# Patient Record
Sex: Male | Born: 1955 | Race: Asian | Hispanic: No | Marital: Married | Smoking: Former smoker
Health system: Southern US, Community
[De-identification: ages and names within clinical notes are randomized; demographics above are authoritative.]

## PROBLEM LIST (undated history)

## (undated) DIAGNOSIS — IMO0002 Reserved for concepts with insufficient information to code with codable children: Secondary | ICD-10-CM

## (undated) DIAGNOSIS — I10 Essential (primary) hypertension: Secondary | ICD-10-CM

## (undated) DIAGNOSIS — K635 Polyp of colon: Secondary | ICD-10-CM

## (undated) DIAGNOSIS — M1990 Unspecified osteoarthritis, unspecified site: Secondary | ICD-10-CM

## (undated) DIAGNOSIS — E785 Hyperlipidemia, unspecified: Secondary | ICD-10-CM

## (undated) DIAGNOSIS — E119 Type 2 diabetes mellitus without complications: Secondary | ICD-10-CM

## (undated) HISTORY — PX: COLONOSCOPY: SHX174

## (undated) HISTORY — DX: Hyperlipidemia, unspecified: E78.5

## (undated) HISTORY — PX: COLON SURGERY: SHX602

## (undated) HISTORY — DX: Reserved for concepts with insufficient information to code with codable children: IMO0002

## (undated) HISTORY — DX: Unspecified osteoarthritis, unspecified site: M19.90

## (undated) HISTORY — DX: Essential (primary) hypertension: I10

## (undated) HISTORY — DX: Type 2 diabetes mellitus without complications: E11.9

---

## 2014-08-11 ENCOUNTER — Ambulatory Visit (INDEPENDENT_AMBULATORY_CARE_PROVIDER_SITE_OTHER): Payer: 59

## 2014-08-11 ENCOUNTER — Ambulatory Visit (INDEPENDENT_AMBULATORY_CARE_PROVIDER_SITE_OTHER): Payer: 59 | Admitting: Family Medicine

## 2014-08-11 VITALS — BP 110/70 | HR 88 | Temp 98.1°F | Resp 18 | Ht 62.5 in | Wt 156.6 lb

## 2014-08-11 DIAGNOSIS — R202 Paresthesia of skin: Secondary | ICD-10-CM | POA: Diagnosis not present

## 2014-08-11 DIAGNOSIS — Z8679 Personal history of other diseases of the circulatory system: Secondary | ICD-10-CM | POA: Diagnosis not present

## 2014-08-11 DIAGNOSIS — M6530 Trigger finger, unspecified finger: Secondary | ICD-10-CM | POA: Diagnosis not present

## 2014-08-11 DIAGNOSIS — M79645 Pain in left finger(s): Secondary | ICD-10-CM

## 2014-08-11 DIAGNOSIS — G5602 Carpal tunnel syndrome, left upper limb: Secondary | ICD-10-CM

## 2014-08-11 DIAGNOSIS — G5601 Carpal tunnel syndrome, right upper limb: Secondary | ICD-10-CM

## 2014-08-11 DIAGNOSIS — G5603 Carpal tunnel syndrome, bilateral upper limbs: Secondary | ICD-10-CM

## 2014-08-11 LAB — GLUCOSE, POCT (MANUAL RESULT ENTRY): POC GLUCOSE: 84 mg/dL (ref 70–99)

## 2014-08-11 LAB — POCT GLYCOSYLATED HEMOGLOBIN (HGB A1C): Hemoglobin A1C: 5.7

## 2014-08-11 MED ORDER — PREDNISONE 20 MG PO TABS: ORAL_TABLET | ORAL | Status: DC

## 2014-08-11 NOTE — Patient Instructions (Addendum)
Trigger Finger Trigger finger (digital tendinitis and stenosing tenosynovitis) is a common disorder that causes an often painful catching of the fingers or thumb. It occurs as a clicking, snapping, or locking of a finger in the palm of the hand. This is caused by a problem with the tendons that flex or bend the fingers sliding smoothly through their sheaths. The condition may occur in any finger or a couple fingers at the same time.  The finger may lock with the finger curled or suddenly straighten out with a snap. This is more common in patients with rheumatoid arthritis and diabetes. Left untreated, the condition may get worse to the point where the finger becomes locked in flexion, like making a fist, or less commonly locked with the finger straightened out. CAUSES   Inflammation and scarring that lead to swelling around the tendon sheath.  Repeated or forceful movements.  Rheumatoid arthritis, an autoimmune disease that affects joints.  Gout.  Diabetes mellitus. SIGNS AND SYMPTOMS  Soreness and swelling of your finger.  A painful clicking or snapping as you bend and straighten your finger. DIAGNOSIS  Your health care provider will do a physical exam of your finger to diagnose trigger finger. TREATMENT   Splinting for 6-8 weeks may be helpful.  Nonsteroidal anti-inflammatory medicines (NSAIDs) can help to relieve the pain and inflammation.  Cortisone injections, along with splinting, may speed up recovery. Several injections may be required. Cortisone may give relief after one injection.  Surgery is another treatment that may be used if conservative treatments do not work. Surgery can be minor, without incisions (a cut does not have to be made), and can be done with a needle through the skin.  Other surgical choices involve an open procedure in which the surgeon opens the hand through a small incision and cuts the pulley so the tendon can again slide smoothly. Your hand will still  work fine. HOME CARE INSTRUCTIONS  Apply ice to the injured area, twice per day:  Put ice in a plastic bag.  Place a towel between your skin and the bag.  Leave the ice on for 20 minutes, 3-4 times a day.  Rest your hand often. MAKE SURE YOU:   Understand these instructions.  Will watch your condition.  Will get help right away if you are not doing well or get worse. Document Released: 01/01/2004 Document Revised: 11/13/2012 Document Reviewed: 08/13/2012 [facility] Patient Information 2015 [place], [place]. This information is not intended to replace advice given to you by your health care provider. Make sure you discuss any questions you have with your health care provider.    Trigger Point Injection Trigger points are areas where you have muscle pain. A trigger point injection is a shot given in the trigger point to relieve that pain. A trigger point might feel like a knot in your muscle. It hurts to press on a trigger point. Sometimes the pain spreads out (radiates) to other parts of the body. For example, pressing on a trigger point in your shoulder might cause pain in your arm or neck. You might have one trigger point. Or, you might have more than one. People often have trigger points in their upper back and lower back. They also occur often in the neck and shoulders. Pain from a trigger point lasts for a long time. It can make it hard to keep moving. You might not be able to do the exercise or physical therapy that could help you deal with the pain. A  trigger point injection may help. It does not work for everyone. But, it may relieve your pain for a few days or a few months. A trigger point injection does not cure long-lasting (chronic) pain. LET YOUR CAREGIVER KNOW ABOUT:  Any allergies (especially to latex, lidocaine, or steroids).  Blood-thinning medicines that you take. These drugs can lead to bleeding or bruising after an injection. They  include:  Aspirin.  Ibuprofen.  Clopidogrel.  Warfarin.  Other medicines you take. This includes all vitamins, herbs, eyedrops, over-the-counter medicines, and creams.  Use of steroids.  Recent infections.  Past problems with numbing medicines.  Bleeding problems.  Surgeries you have had.  Other health problems. RISKS AND COMPLICATIONS A trigger point injection is a safe treatment. However, problems may develop, such as:  Minor side effects usually go away in 1 to 2 days. These may include:  Soreness.  Bruising.  Stiffness.  More serious problems are rare. But, they may include:  Bleeding under the skin (hematoma).  Skin infection.  Breaking off of the needle under your skin.  Lung puncture.  The trigger point injection may not work for you. BEFORE THE PROCEDURE You may need to stop taking any medicine that thins your blood. This is to prevent bleeding and bruising. Usually these medicines are stopped several days before the injection. No other preparation is needed. PROCEDURE  A trigger point injection can be given in your caregiver's office or in a clinic. Each injection takes 2 minutes or less.  Your caregiver will feel for trigger points. The caregiver may use a marker to circle the area for the injection.  The skin over the trigger point will be washed with a germ-killing (antiseptic) solution.  The caregiver pinches the spot for the injection.  Then, a very thin needle is used for the shot. You may feel pain or a twitching feeling when the needle enters the trigger point.  A numbing solution may be injected into the trigger point. Sometimes a drug to keep down swelling, redness, and warmth (inflammation) is also injected.  Your caregiver moves the needle around the trigger zone until the tightness and twitching goes away.  After the injection, your caregiver may put gentle pressure over the injection site.  Then it is covered with a  bandage. AFTER THE PROCEDURE  You can go right home after the injection.  The bandage can be taken off after a few hours.  You may feel sore and stiff for 1 to 2 days.  Go back to your regular activities slowly. Your caregiver may ask you to stretch your muscles. Do not do anything that takes extra energy for a few days.  Follow your caregiver's instructions to manage and treat other pain. Document Released: 03/02/2011 Document Revised: 07/08/2012 Document Reviewed: 03/02/2011 [facility] Patient Information 2015 [place], [place]. This information is not intended to replace advice given to you by your health care provider. Make sure you discuss any questions you have with your health care provider.   Carpal Tunnel Syndrome The carpal tunnel is a narrow area located on the palm side of your wrist. The tunnel is formed by the wrist bones and ligaments. Nerves, blood vessels, and tendons pass through the carpal tunnel. Repeated wrist motion or certain diseases may cause swelling within the tunnel. This swelling pinches the main nerve in the wrist (median nerve) and causes the painful hand and arm condition called carpal tunnel syndrome. CAUSES   Repeated wrist motions.  Wrist injuries.  Certain diseases like  arthritis, diabetes, alcoholism, hyperthyroidism, and kidney failure.  Obesity.  Pregnancy. SYMPTOMS   A "pins and needles" feeling in your fingers or hand, especially in your thumb, index and middle fingers.  Tingling or numbness in your fingers or hand.  An aching feeling in your entire arm, especially when your wrist and elbow are bent for long periods of time.  Wrist pain that goes up your arm to your shoulder.  Pain that goes down into your palm or fingers.  A weak feeling in your hands. DIAGNOSIS  Your health care provider will take your history and perform a physical exam. An electromyography test may be needed. This test measures electrical signals sent out by your  nerves into the muscles. The electrical signals are usually slowed by carpal tunnel syndrome. You may also need X-rays. TREATMENT  Carpal tunnel syndrome may clear up by itself. Your health care provider may recommend a wrist splint or medicine such as a nonsteroidal anti-inflammatory medicine. Cortisone injections may help. Sometimes, surgery may be needed to free the pinched nerve.  HOME CARE INSTRUCTIONS   Take all medicine as directed by your health care provider. Only take over-the-counter or prescription medicines for pain, discomfort, or fever as directed by your health care provider.  If you were given a splint to keep your wrist from bending, wear it as directed. It is important to wear the splint at night. Wear the splint for as long as you have pain or numbness in your hand, arm, or wrist. This may take 1 to 2 months.  Rest your wrist from any activity that may be causing your pain. If your symptoms are work-related, you may need to talk to your employer about changing to a job that does not require using your wrist.  Put ice on your wrist after long periods of wrist activity.  Put ice in a plastic bag.  Place a towel between your skin and the bag.  Leave the ice on for 15-20 minutes, 03-04 times a day.  Keep all follow-up visits as directed by your health care provider. This includes any orthopedic referrals, physical therapy, and rehabilitation. Any delay in getting necessary care could result in a delay or failure of your condition to heal. SEEK IMMEDIATE MEDICAL CARE IF:   You have new, unexplained symptoms.  Your symptoms get worse and are not helped or controlled with medicines. MAKE SURE YOU:   Understand these instructions.  Will watch your condition.  Will get help right away if you are not doing well or get worse. Document Released: 03/10/2000 Document Revised: 07/28/2013 Document Reviewed: 01/27/2011 [facility] Patient Information 2015 [place], [place]. This  information is not intended to replace advice given to you by your health care provider. Make sure you discuss any questions you have with your health care provider.    H?i Ch?ng ?ng C? Tay (Carpal Tunnel Syndrome) ?ng c? tay l m?t vng h?p n?m ? m?t lng bn tay c?a c? tay. ?ng ???c hnh thnh b?i cc x??ng c? tay v dy ch?ng. Dy th?n kinh, m?ch mu v gn ?i qua ?ng c? tay. C? ??ng c? tay l?p ?i l?p l?i ho?c cc b?nh l nh?t ??nh c th? gy s?ng ? bn trong ?ng ny. Ch? s?ng ny b ch?t dy th?n kinh chnh ? c? tay (dy th?n kinh gi?a) v gy tnh tr?ng ?au ? bn tay v cnh tay ???c g?i l h?i ch?ng ?ng c? tay.  NGUYN NHN  C? ??ng c? tay l?p ?  i l?p l?i.  Ch?n th??ng c? tay.  M?t s? b?nh nh?t ??nh nh? vim kh?p, b?nh ti?u ???ng, nghi?n r??u, c??ng gip v suy th?n.  Bo ph.  Mang thai. TRI?U CH?NG  C?m gic "r?n r?n nh? ki?n b" ? cc ngn tay ho?c bn tay.  ?au bu?t ho?c t ? ngn tay ho?c bn tay.  C?m gic ?au nh?c ? ton b? cnh tay.  ?au c? tay lan ln cnh tay t?i vai.  ?au lan xu?ng lng bn tay ho?c ngn tay.  C?m gic y?u ? bn tay. CH?N ?ON Chuyn gia ch?m [place] s?c kh?e s? xem b?nh s? c?a b?n v khm th?c th?. C th? c?n ph?i ki?m tra ?i?n c? ??. Ki?m tra ny ?o tn hi?u ?i?n do cc c? g?i ?i. Cc tn hi?u ?i?n ny th??ng b? ch?m l?i b?i h?i ch?ng ?ng c? tay. B?n c?ng c th? c?n ph?i ch?p X-quang. ?I?U TR? H?i ch?ng ?ng c? tay c th? t? kh?i. Chuyn gia ch?m [place] s?c kh?e c?a b?n c th? khuy?n ngh? n?p c? tay ho?c s? d?ng thu?c, ch?ng h?n nh? thu?c khng vim khng c steroid. Tim cortison c th? h?u ch. ?i khi, c th? c?n ph?i ph?u thu?t ?? gi?i phng dy th?n kinh b? b ch?t. H??NG D?N CH?M [place] T?I NH  S? d?ng t?t c? thu?c theo ch? d?n c?a chuyn gia ch?m [place] s?c kh?e. Ch? s? d?ng thu?c khng c?n k toa ho?c thu?c c?n k toa ?? gi?m ?au, gi?m c?m gic kh ch?u ho?c h? s?t theo ch? d?n c?a chuyn gia ch?m [place] s?c kh?e c?a b?n.  N?u b?n ???c n?p ?? gi? cho  c? tay khng b? cong, hy s? d?ng n?p theo ch? d?n. ?i?u quan tr?ng l c?n mang n?p vo ban ?m. S? d?ng n?p cho ??n ch?ng no b?n cn b? ?au ho?c t ? bn tay, cnh tay ho?c c? tay. C th? m?t 1 ??n 2 thng.  Khng s? d?ng c? tay vo b?t k? ho?t ??ng no c th? gy ?au. N?u cc tri?u ch?ng c?a b?n c lin quan ??n cng vi?c, b?n c th? c?n ph?i ni chuy?n v?i ng??i s? d?ng lao ??ng c?a mnh v? vi?c ??i sang m?t cng vi?c khng c?n s? d?ng c? tay.  Ch??m ? l?nh ln c? tay sau th?i gian ho?t ??ng c? tay ko di.  Cho ? l?nh vo ti nh?a.  ??t kh?n t?m gi?a da v ti.  Ch??m ? l?nh trong 15 ??n 20 pht, 3 ??n 4 l?n m?i ngy.  Gi? m?i cu?c h?n khm l?i theo ch? d?n c?a chuyn gia ch?m [place] s?c kh?e. Cc cu?c h?n ny bao g?m b?t k? cu?c h?n chuy?n khm chuyn khoa ch?nh hnh, v?t l tr? li?u v ph?c h?i ch?c n?ng no. B?t k? s? ch?m tr? no trong vi?c nh?n ch?m [place] c?n thi?t c th? d?n ??n s? ch?m tr? ho?c tnh tr?ng b?nh c?a b?n khng kh?i. HY NGAY L?P T?C ?I KHM N?U:  B?n c nh?ng tri?u ch?ng m?i khng r nguyn nhn.  Cc tri?u ch?ng c?a b?n tr? nn t?i t? h?n v khng c?i thi?n ho?c ki?m sot ???c b?ng thu?c. ??M B?O B?N:  Hi?u cc h??ng d?n ny.  S? theo di tnh tr?ng c?a mnh.  S? yu c?u tr? gip ngay l?p t?c n?u b?n c?m th?y khng ?? ho?c tnh tr?ng tr?m tr?ng h?n. Document Released: 03/13/2005 Document Revised: 11/13/2012 [facility] Patient Information 2015 [employer]. This information is not intended to replace advice given to you by your health care provider. Make sure you discuss any questions you have with your health care provider.  

## 2014-08-11 NOTE — Progress Notes (Signed)
MRN: [id number] DOB: 1955-05-25  Subjective:   David Morales is a 59 y.o. male presenting for chief complaint of Other  Reports 1 month history of worsening hand numbness and tingling and left thumb pain. Pain is constant, achy, worst in the morning ~15 minutes, becomes sharp with flexion of his left thumb. Has not tried medications for relief. Denies trauma, numbness and tingling of his feet, polyuria, polydipsia, polyphagia. Denies decreased ROM or strength, erythema, edema, radiation of pain. Denies previous hand surgery or hand injury. Works in [employer], does food prep and washes dishes. Diet consists of rice, vegetables, drinks plenty of water, also drinks coffee. Smokes 1/3 ppd for the past 5 years, does not drink alcohol. Denies any other aggravating or relieving factors, no other questions or concerns.  [name] currently has no medications in their medication list. He has No Known Allergies.  [name]  has a past medical history of Hyperlipidemia and Hypertension. Also  has past surgical history that includes Colon surgery.  ROS As in subjective.  Objective:   Vitals: BP 110/70 mmHg  Pulse 88  Temp(Src) 98.1 F (36.7 C) (Oral)  Resp 18  Ht 5' 2.5" (1.588 m)  Wt 156 lb 9.6 oz (71.033 kg)  BMI 28.17 kg/m2  SpO2 97%  Physical Exam  Constitutional: He is oriented to person, place, and time. He appears well-developed and well-nourished.  Cardiovascular: Normal rate, regular rhythm and intact distal pulses.  Exam reveals no gallop and no friction rub.   No murmur heard. Pulmonary/Chest: No respiratory distress. He has no wheezes. He has no rales. He exhibits no tenderness.  Musculoskeletal:       Left hand: He exhibits tenderness (over depicted areas, worse with flexion of thumb). He exhibits normal range of motion, normal capillary refill, no deformity, no laceration and no swelling. Normal sensation noted. Normal strength noted.       Hands: Positive Phalens test, negative  Tinnel.  Neurological: He is alert and oriented to person, place, and time.  Skin: Skin is warm and dry. No rash noted. No erythema. No pallor.   [facility] reading (PRIMARY) by  Dr. Joseph Art and PA-Toyia Jelinek. Left thumb: Normal x-ray.  Results for orders placed or performed in visit on 08/11/14 (from the past 24 hour(s))  POCT glycosylated hemoglobin (Hb A1C)     Status: None   Collection Time: 08/11/14  5:55 PM  Result Value Ref Range   Hemoglobin A1C 5.7   POCT glucose (manual entry)     Status: None   Collection Time: 08/11/14  5:55 PM  Result Value Ref Range   POC Glucose 84 70 - 99 mg/dl   Assessment and Plan :   1. Trigger finger, acquired 2. Thumb pain, left - will start steroid course, advised modification of activities with hands at work, patient is to call back following steroid course to see if there was any improvement. Consider referral to ortho for further intervention.  3. Paresthesia of both hands 4. Bilateral carpal tunnel syndrome - steroid course as above may help patient, also advised to wear wrist splint at night. Modification of work with hands as above. Consider NSAID therapy or referral to ortho.  5. Patient has a history of hypertension, stopped taking medications ~2 years ago. Has not had follow up. States that his blood pressure has been normal since coming off medications. Advised that he have complete physical exam.  Jaynee Eagles, PA-C [facility] [phone] 08/11/2014  5:03 PM    History and physical exam reviewed with [name] at bedside. Agree with this plan.  Signed, Carola Frost.D.

## 2014-09-01 ENCOUNTER — Telehealth: Payer: Self-pay

## 2014-09-01 DIAGNOSIS — M6530 Trigger finger, unspecified finger: Secondary | ICD-10-CM

## 2014-09-01 NOTE — Telephone Encounter (Signed)
Pt daughter is calling about getting pt a referral about his trigger finger

## 2014-09-02 NOTE — Telephone Encounter (Signed)
Spoke with daughter, advised referral put in.

## 2014-11-04 ENCOUNTER — Ambulatory Visit (INDEPENDENT_AMBULATORY_CARE_PROVIDER_SITE_OTHER): Payer: 59 | Admitting: Family Medicine

## 2014-11-04 VITALS — BP 94/52 | HR 83 | Temp 98.3°F | Resp 14 | Ht 61.5 in | Wt 148.6 lb

## 2014-11-04 DIAGNOSIS — K5900 Constipation, unspecified: Secondary | ICD-10-CM | POA: Diagnosis not present

## 2014-11-04 DIAGNOSIS — Z860100 Personal history of colon polyps, unspecified: Secondary | ICD-10-CM

## 2014-11-04 DIAGNOSIS — K921 Melena: Secondary | ICD-10-CM | POA: Diagnosis not present

## 2014-11-04 LAB — POCT CBC
Granulocyte percent: 55.5 %G (ref 37–80)
HCT, POC: 56 % — AB (ref 43.5–53.7)
Hemoglobin: 15 g/dL (ref 14.1–18.1)
Lymph, poc: 3.1 (ref 0.6–3.4)
MCH, POC: 30 pg (ref 27–31.2)
MCHC: 32.6 g/dL (ref 31.8–35.4)
MCV: 92.1 fL (ref 80–97)
MID (cbc): 1 — AB (ref 0–0.9)
MPV: 7.6 fL (ref 0–99.8)
POC Granulocyte: 5.1 (ref 2–6.9)
POC LYMPH PERCENT: 33.6 %L (ref 10–50)
POC MID %: 10.9 %M (ref 0–12)
Platelet Count, POC: 262 10*3/uL (ref 142–424)
RBC: 4.99 M/uL (ref 4.69–6.13)
RDW, POC: 13 %
WBC: 9.1 10*3/uL (ref 4.6–10.2)

## 2014-11-04 NOTE — Patient Instructions (Signed)
To bn (Constipation) To bn l khi m?t ng??i ?i ??i ti?n t h?n ba l?n trong m?t [place]?n, ??i ti?n kh kh?n, ho?c ??i ti?n ra phn kh, c?ng, ho?c to h?n bnh th??ng. [place]hi [place]?i cng cao th cng d? b? to bn. N?u qu v? c? g?ng ch?a to bn b?ng cc lo?i thu?c gip qu v? ??i ti?n ???c (thu?c nhu?n trng), b?nh c th? n?ng thm. S? d?ng thu?c nhu?n trng trong th?i gian di c th? lm cho cc c? c?a ru?t gi y?u ?i. Ch? ?? ?n thi?u ch?t x?, khng u?ng ?? n??c, v dng m?t s? lo?i thu?c nh?t ??nh c th? lm to bo n?ng thm.  NGUYN NHN.   M?t s? lo?i thu?c nh?t ??nh nh? thu?c ch?ng tr?m c?m, thu?c gi?m ?au, thu?c c b? sung s?t, thu?c trung ha axit d?ch v?, v thu?c l?i ti?u.  M?t s? b?nh l nh?t ??nh nh? ti?u ???ng, h?i ch?ng ru?t kch thch (IBS), b?nh c?a tuy?n gip, ho?c tr?m c?m.  Khng u?ng ?? n??c.  Khng ?n ?? th?c ?n giu ch?t x?.  C?ng th?ng ho?c do ?i l?i.  t ho?t ??ng thn th? ho?c th? d?c.  Nh?n ?i ??i ti?n.  S? d?ng qu nhi?u thu?c nhu?n trng. D?U HI?U V TRI?U CH?NG   ?i ??i ti?n t h?n ba l?n m?i [place]?n.  Ph?i r?n m?nh ?? ??i ti?n.  ??i ti?n ra phn c?ng, kh, ho?c to h?n bnh th??ng.  C?m th?y ??y b?ng ho?c ch??ng b?ng.  ?au ? vng b?ng d??i.  Khng c?m th?y tho?i mi sau khi ??i ti?n. CH?N ?ON  Chuyn gia ch?m [place] s?c kh?e c?a qu v? s? h?i v? b?nh s? v khm th?c th? cho qu v?. C th? c?n ki?m tra thm trong tr??ng h?p to bn n?ng. M?t s? ki?m tra c th? bao g?m:  Ch?p X quang c ch?t c?n quang ?? ki?m tra tr?c trng, ??i trng, v ?i khi c? ru?t non c?a qu v?.  N?i soi tr?c trng sigma ?? ki?m tra ph?n pha d??i c?a ??i trng.  Th? thu?t soi ??i trng ?? khm ton b? ??i trng. ?I?U TR?  ?i?u tr? ty thu?c vo m?c ?? tr?m tr?ng c?a ch?ng to bn v nguyn nhn gy to bn. ?i?u tr? thng qua ch? ?? ?n u?ng bao g?m u?ng nhi?u n??c h?n v ?n th?c ?n c nhi?u ch?t x? h?n. ?i?u tr? thng qua l?i s?ng c th? bao g?m vi?c t?p th? d?c th??ng xuyn. N?u  nh?ng khuy?n ngh? v? ch? ?? ?n v l?i s?ng khng c tc d?ng, chuyn gia ch?m [place] s?c kh?e c th? khuy?n ngh? qu v? dng cc lo?i thu?c nhu?n trng khng c?n k ??n ?? gip qu v? ??i ti?n. C th? ph?i k ??n thu?c c?n k ??n n?u thu?c khng c?n k ??n khng c tc d?ng.  H??NG D?N CH?M [place] T?I NH   ?n th?c ?n c nhi?u ch?t x? nh? tri cy, rau, ng? c?c nguyn h?t, v cc lo?i ??u.  H?n ch? th?c ?n c nhi?u ch?t bo v ???ng ch? bi?n s?n, ch?ng h?n khoai ty chin, bnh hamburger, bnh quy, k?o, v soda.  C th? dng th?c ph?m ch?c n?ng c b? sung ch?t x? vo kh?u ph?n ?n c?a qu v? n?u qu v? khng th? dng ?? ch?t x? t? th?c ?n.  U?ng ?? n??c ?? gi? cho n??c ti?u trong ho?c vng nh?t.  T?p th? d?c th??ng xuyn ho?c  theo ch? d?n c?a chuyn gia ch?m [place] s?c kh?e.  Vo nh v? sinh ngay khi qu v? c nhu c?u. Khng nh?n ?i ??i ti?n.  Ch? s? d?ng thu?c khng c?n k ??n ho?c thu?c c?n k ??n theo ch? d?n c?a chuyn gia ch?m [place] s?c kh?e.Khng dng cc lo?i thu?c ch?ng to bn khc m khng bn b?c tr??c v?i chuyn gia ch?m [place] s?c kh?e. NGAY L?P T?C ?I KHM N?U:   ?i ??i ti?n ra mu ?? t??i.  Ch?ng to bn ko di h?n 4 ngy v tr?m tr?ng h?n.  Qu v? b? ?au b?ng ho?c ?au tr?c trng.  Phn c?a qu v? m?ng, trng nh? bt ch.  Qu v? b? s?t cn khng r nguyn nhn. ??M B?O QU V?:   Hi?u r cc h??ng d?n ny.  S? theo di tnh tr?ng c?a mnh.  S? yu c?u tr? gip ngay l?p t?c n?u qu v? c?m th?y khng kh?e ho?c th?y tr?m tr?ng h?n. Document Released: 06/28/2010 Document Revised: 03/18/2013 [facility] Patient Information 2015 [employer]. This information is not intended to replace advice given to you by your health care provider. Make sure you discuss any questions you have with your health care provider.

## 2014-11-04 NOTE — Progress Notes (Signed)
Chief Complaint:  Chief Complaint  Patient presents with  . Blood In Stools    x 1 month  . Constipation    HPI: David Morales is a 59 y.o. male who reports to [facility] today complaining of needing a referral to GI doctor.  History of colon polyps in [place], was told by GI doctor there thathe needed a colonoscopy every 6 months to 1 year. He had one about 1 year ago and had polyps removed.  He was more sedentary in [place] but now he is more active so has lost weight and no longer needs to be on meds.  He was on HTN and hyperlipidemia meds. His diet is a balanced diet with fiber, occasional constipation and has to strain, No recent melena or hematochezia but willhave occ bloody streaks on tissue paper after he strains.  He has no family hx of colon cancer  Emiri [phone] ( step daughter speaks [place])    Past Medical History  Diagnosis Date  . Hyperlipidemia   . Hypertension    Past Surgical History  Procedure Laterality Date  . Colon surgery     Social History   Social History  . Marital Status: Married    Spouse Name: N/A  . Number of Children: N/A  . Years of Education: N/A   Social History Main Topics  . Smoking status: Current Every Day Smoker -- 5 years  . Smokeless tobacco: None  . Alcohol Use: 0.0 oz/week    0 Standard drinks or equivalent per week  . Drug Use: No  . Sexual Activity: Not Asked   Other Topics Concern  . None   Social History Narrative   History reviewed. No pertinent family history. No Known Allergies Prior to Admission medications   Not on File     ROS: The patient denies fevers, chills, night sweats, unintentional weight loss, chest pain, palpitations, wheezing, dyspnea on exertion, nausea, vomiting, abdominal pain, dysuria, hematuria, melena, numbness, weakness, or tingling.   All other systems have been reviewed and were otherwise negative with the exception of those mentioned in the HPI and as above.    PHYSICAL  EXAM: Filed Vitals:   11/04/14 1903  BP: 94/52  Pulse: 83  Temp: 98.3 F (36.8 C)  Resp: 14   Body mass index is 27.63 kg/(m^2).   General: Alert, no acute distress HEENT:  Normocephalic, atraumatic, oropharynx patent. EOMI, PERRLA Cardiovascular:  Regular rate and rhythm, no rubs murmurs or gallops.  No Carotid bruits, radial pulse intact. No pedal edema.  Respiratory: Clear to auscultation bilaterally.  No wheezes, rales, or rhonchi.  No cyanosis, no use of accessory musculature Abdominal: No organomegaly, abdomen is soft and non-tender, positive bowel sounds. No masses. Skin: No rashes. Neurologic: Facial musculature symmetric. Psychiatric: Patient acts appropriately throughout our interaction. Lymphatic: No cervical or submandibular lymphadenopathy Musculoskeletal: Gait intact. No edema, tenderness No appreciable fissures or hemorrhoids on rectal exam  LABS: Results for orders placed or performed in visit on 11/04/14  POCT CBC  Result Value Ref Range   WBC 9.1 4.6 - 10.2 K/uL   Lymph, poc 3.1 0.6 - 3.4   POC LYMPH PERCENT 33.6 10 - 50 %L   MID (cbc) 1.0 (A) 0 - 0.9   POC MID % 10.9 0 - 12 %M   POC Granulocyte 5.1 2 - 6.9   Granulocyte percent 55.5 37 - 80 %G   RBC 4.99 4.69 - 6.13 M/uL   Hemoglobin 15.0 14.1 -  18.1 g/dL   HCT, POC 56.0 (A) 43.5 - 53.7 %   MCV 92.1 80 - 97 fL   MCH, POC 30.0 27 - 31.2 pg   MCHC 32.6 31.8 - 35.4 g/dL   RDW, POC 13.0 %   Platelet Count, POC 262 142 - 424 K/uL   MPV 7.6 0 - 99.8 fL     EKG/XRAY:   Primary read interpreted by Dr. Marin Comment at [facility].   ASSESSMENT/PLAN: Encounter Diagnoses  Name Primary?  . History of colonic polyps Yes  . Constipation, unspecified constipation type   . Bloody stool    Otc miralax, increase fiber and water intake prn  Refer to GI [facility] in several months for cholesterol check  Gross sideeffects, risk and benefits, and alternatives of medications d/w patient. Patient is aware that all medications have  potential sideeffects and we are unable to predict every sideeffect or drug-drug interaction that may occur.  Farah Benish DO  11/04/2014 8:10 PM

## 2014-11-05 LAB — COMPLETE METABOLIC PANEL WITHOUT GFR
AST: 29 U/L (ref 10–35)
Albumin: 4.3 g/dL (ref 3.6–5.1)
Alkaline Phosphatase: 69 U/L (ref 40–115)
GFR, Est African American: >89 mL/min (ref >=60)
Glucose, Bld: 133 mg/dL — ABNORMAL HIGH (ref 65–99)
Potassium: 3.9 mmol/L (ref 3.5–5.3)
Sodium: 138 mmol/L (ref 135–146)

## 2014-11-05 LAB — COMPLETE METABOLIC PANEL WITH GFR
ALT: 38 U/L (ref 9–46)
BUN: 22 mg/dL (ref 7–25)
CO2: 30 mmol/L (ref 20–31)
Calcium: 9.4 mg/dL (ref 8.6–10.3)
Chloride: 103 mmol/L (ref 98–110)
Creat: 1.01 mg/dL (ref 0.70–1.33)
GFR, Est Non African American: 81 mL/min (ref >=60)
Total Bilirubin: 0.3 mg/dL (ref 0.2–1.2)
Total Protein: 7.2 g/dL (ref 6.1–8.1)

## 2014-11-27 ENCOUNTER — Encounter: Payer: Self-pay | Admitting: Family Medicine

## 2015-05-13 ENCOUNTER — Encounter (HOSPITAL_BASED_OUTPATIENT_CLINIC_OR_DEPARTMENT_OTHER): Payer: Self-pay

## 2015-05-13 ENCOUNTER — Ambulatory Visit (HOSPITAL_BASED_OUTPATIENT_CLINIC_OR_DEPARTMENT_OTHER)
Admission: RE | Admit: 2015-05-13 | Discharge: 2015-05-13 | Disposition: A | Discharge status: Home / Self Care | Payer: BLUE CROSS/BLUE SHIELD | Source: Ambulatory Visit | Attending: Physician Assistant | Admitting: Physician Assistant

## 2015-05-13 ENCOUNTER — Ambulatory Visit (INDEPENDENT_AMBULATORY_CARE_PROVIDER_SITE_OTHER): Payer: BLUE CROSS/BLUE SHIELD | Admitting: Physician Assistant

## 2015-05-13 VITALS — BP 110/70 | HR 72 | Temp 97.9°F | Resp 18 | Ht 62.0 in | Wt 153.8 lb

## 2015-05-13 DIAGNOSIS — Z87891 Personal history of nicotine dependence: Secondary | ICD-10-CM | POA: Diagnosis not present

## 2015-05-13 DIAGNOSIS — C189 Malignant neoplasm of colon, unspecified: Secondary | ICD-10-CM | POA: Diagnosis present

## 2015-05-13 DIAGNOSIS — I10 Essential (primary) hypertension: Secondary | ICD-10-CM | POA: Diagnosis not present

## 2015-05-13 DIAGNOSIS — E785 Hyperlipidemia, unspecified: Secondary | ICD-10-CM | POA: Diagnosis not present

## 2015-05-13 DIAGNOSIS — K6389 Other specified diseases of intestine: Secondary | ICD-10-CM | POA: Diagnosis not present

## 2015-05-13 LAB — POCT CBC
GRANULOCYTE PERCENT: 55.9 % (ref 37–80)
HCT, POC: 44.3 % (ref 43.5–53.7)
HEMOGLOBIN: 15.6 g/dL (ref 14.1–18.1)
Lymph, poc: 2.9 (ref 0.6–3.4)
MCH: 32 pg — AB (ref 27–31.2)
MCHC: 35.3 g/dL (ref 31.8–35.4)
MCV: 90.8 fL (ref 80–97)
MID (CBC): 0.7 (ref 0–0.9)
MPV: 7.7 fL (ref 0–99.8)
POC Granulocyte: 4.5 (ref 2–6.9)
POC LYMPH PERCENT: 35.2 %L (ref 10–50)
POC MID %: 8.9 % (ref 0–12)
Platelet Count, POC: 263 10*3/uL (ref 142–424)
RBC: 4.88 M/uL (ref 4.69–6.13)
RDW, POC: 12.6 %
WBC: 8.1 10*3/uL (ref 4.6–10.2)

## 2015-05-13 MED ORDER — IOHEXOL 300 MG/ML  SOLN
100.0000 mL | Freq: Once | INTRAMUSCULAR | Status: AC | PRN
  Administered 2015-05-13: 100 mL via INTRAVENOUS

## 2015-05-13 NOTE — Patient Instructions (Signed)
Go to [place] register 1st floor radiology for outpatient CT  [facility]  [place]

## 2015-05-13 NOTE — Progress Notes (Signed)
05/13/2015 5:52 PM   DOB: Aug 02, 1955 / MRN: [id number]  SUBJECTIVE:  David Morales is a 60 y.o. male presenting for a recent colonoscopy that was positive for adenocarcinoma ten days ago. Per the surgeon's report, which is in [place], the lesion needs to be cut out immediately.  He is here today for a referral to a Psychologist, sport and exercise.      He has No Known Allergies.   He  has a past medical history of Hyperlipidemia and Hypertension.    He  reports that he has quit smoking. He does not have any smokeless tobacco history on file. He reports that he drinks alcohol. He reports that he does not use illicit drugs. He  has no sexual activity history on file. The patient  has past surgical history that includes Colon surgery and Colonoscopy.  His family history is not on file.  Review of Systems  Constitutional: Negative for fever and chills.  Respiratory: Negative for cough.   Cardiovascular: Negative for chest pain.  Gastrointestinal: Negative for nausea.  Genitourinary: Negative for dysuria.  Musculoskeletal: Negative for myalgias.  Skin: Negative for rash.  Neurological: Negative for dizziness and headaches.  Psychiatric/Behavioral: Negative for depression.    Problem list and medications reviewed and updated by myself where necessary, and exist elsewhere in the encounter.   OBJECTIVE:  BP 110/70 mmHg  Pulse 72  Temp(Src) 97.9 F (36.6 C) (Oral)  Resp 18  Ht 5\' 2"  (1.575 m)  Wt 153 lb 12.8 oz (69.763 kg)  BMI 28.12 kg/m2  SpO2 98%  Physical Exam  Constitutional: He is oriented to person, place, and time. He appears well-developed. He does not appear ill.  Eyes: Conjunctivae and EOM are normal. Pupils are equal, round, and reactive to light.  Cardiovascular: Normal rate.   Pulmonary/Chest: Effort normal.  Abdominal: Soft. Bowel sounds are normal. He exhibits no mass. There is no tenderness. There is no rebound and no guarding.  Musculoskeletal: Normal range of motion.  Neurological: He  is alert and oriented to person, place, and time. No cranial nerve deficit. Coordination normal.  Skin: Skin is warm and dry. He is not diaphoretic.  Psychiatric: He has a normal mood and affect.  Nursing note and vitals reviewed.   Results for orders placed or performed in visit on 05/13/15 (from the past 72 hour(s))  POCT CBC     Status: Abnormal   Collection Time: 05/13/15 12:31 PM  Result Value Ref Range   WBC 8.1 4.6 - 10.2 K/uL   Lymph, poc 2.9 0.6 - 3.4   POC LYMPH PERCENT 35.2 10 - 50 %L   MID (cbc) 0.7 0 - 0.9   POC MID % 8.9 0 - 12 %M   POC Granulocyte 4.5 2 - 6.9   Granulocyte percent 55.9 37 - 80 %G   RBC 4.88 4.69 - 6.13 M/uL   Hemoglobin 15.6 14.1 - 18.1 g/dL   HCT, POC 44.3 43.5 - 53.7 %   MCV 90.8 80 - 97 fL   MCH, POC 32.0 (A) 27 - 31.2 pg   MCHC 35.3 31.8 - 35.4 g/dL   RDW, POC 12.6 %   Platelet Count, POC 263 142 - 424 K/uL   MPV 7.7 0 - 99.8 fL    Ct Abdomen Pelvis W Contrast  05/13/2015  CLINICAL DATA:  Recent colonoscopy positive for adenocarcinoma 10 days ago, history hypertension, hyperlipidemia, former smoker ; information via interpreter EXAM: CT ABDOMEN AND PELVIS WITH CONTRAST TECHNIQUE: Multidetector CT imaging  of the abdomen and pelvis was performed using the standard protocol following bolus administration of intravenous contrast. Sagittal and coronal MPR images reconstructed from axial data set. CONTRAST:  158mL OMNIPAQUE IOHEXOL 300 MG/ML SOLN IV. Dilute oral contrast. COMPARISON:  None FINDINGS: Lung bases clear. Tiny BILATERAL renal cysts. Liver, gallbladder, spleen, pancreas, kidneys, and adrenal glands otherwise normal appearance. Appendix not definitely visualized. Bladder and ureters normal appearance. Mid to distal colon is un opacified and under distended limiting assessment. Questionable mass lesion at hepatic flexure 4.2 x 2.5 cm image 42 concerning for neoplasm. Remainder of colon grossly unremarkable. Stomach and bowel loops normal appearance.  Question mild wall thickening of the distal esophagus versus small collapsed hiatal hernia. Minimal atherosclerotic calcification. Few scattered normal size mesenteric lymph nodes RIGHT mid abdomen. No additional mass, adenopathy, free fluid, free air, or hernia. Avascular necrosis changes of both femoral heads without collapse. No acute osseous findings otherwise seen. IMPRESSION: Suspected soft tissue mass at hepatic flexure of colon 4.2 x 2.5 cm in size concerning for colonic neoplasm ; recommend correlation with colonoscopy results. No definite evidence of metastatic disease. Questionable mild wall thickening of the distal esophagus versus small collapsed hiatal hernia. Electronically Signed   By: Lavonia Dana M.D.   On: 05/13/2015 14:49    ASSESSMENT AND PLAN  Kiegan was seen today for would like some advice.  Diagnoses and all orders for this visit:  Adenocarcinoma, colon ([place]): Colonoscopy from [place] scanned into media today. Patient's telephone number is [phone].  Will get a CT scan of the belly today.  Pre surgery labs drawn.  Will get him into GI surgery as quickly as possible   -     Ambulatory referral to Colorectal Surgery -     CT Abdomen Pelvis W Contrast; Future -     POCT CBC -     COMPLETE METABOLIC PANEL WITH GFR -     Protime-INR    The patient was advised to call or return to clinic if he does not see an improvement in symptoms or to seek the care of the closest emergency department if he worsens with the above plan.   Philis Fendt, MHS, PA-C [facility] 05/13/2015 5:52 PM

## 2015-05-14 ENCOUNTER — Encounter: Payer: Self-pay | Admitting: Gastroenterology

## 2015-05-14 LAB — COMPLETE METABOLIC PANEL WITH GFR
ALBUMIN: 4.2 g/dL (ref 3.6–5.1)
ALK PHOS: 58 U/L (ref 40–115)
ALT: 24 U/L (ref 9–46)
AST: 21 U/L (ref 10–35)
BILIRUBIN TOTAL: 0.6 mg/dL (ref 0.2–1.2)
BUN: 15 mg/dL (ref 7–25)
CALCIUM: 9.5 mg/dL (ref 8.6–10.3)
CO2: 25 mmol/L (ref 20–31)
CREATININE: 0.85 mg/dL (ref 0.70–1.33)
Chloride: 106 mmol/L (ref 98–110)
GFR, Est Non African American: >89 mL/min (ref >=60)
Glucose, Bld: 120 mg/dL — ABNORMAL HIGH (ref 65–99)
Potassium: 3.8 mmol/L (ref 3.5–5.3)
Sodium: 138 mmol/L (ref 135–146)
TOTAL PROTEIN: 7 g/dL (ref 6.1–8.1)

## 2015-05-14 LAB — PROTIME-INR
INR: 0.97 (ref <1.50)
Prothrombin Time: 13 seconds (ref 11.6–15.2)

## 2015-05-17 ENCOUNTER — Telehealth: Payer: Self-pay | Admitting: Family Medicine

## 2015-05-17 NOTE — Telephone Encounter (Signed)
Dr. Marin Comment please call patient and let him know David Morales got him an appt for evaluation with [facility] tomorrow with Dr. Fuller Plan at 9:10 am. Needs to arrive 10-15 mins early. He will need to take interpretor with him. Also he has been scheduled with GI. He has appt is at [place] with Dr. Fuller Plan Monday 02/27 at 9:30 am. Please arrive 10-15 min early for that appt as well.  [place] Ph# [place]  [place] Ph# [phone]

## 2015-05-20 NOTE — Telephone Encounter (Signed)
Spoke with patient's partner and was told he was not able to afford the 1000 up front cost and had only 200 to pay and already had purchased a plane ticket to go back to [place] to get his colonscopy redone. He left the day before his appt with GI

## 2015-05-24 ENCOUNTER — Ambulatory Visit: Payer: BLUE CROSS/BLUE SHIELD | Admitting: Gastroenterology

## 2016-06-02 ENCOUNTER — Ambulatory Visit (INDEPENDENT_AMBULATORY_CARE_PROVIDER_SITE_OTHER): Payer: BLUE CROSS/BLUE SHIELD | Admitting: Urgent Care

## 2016-06-02 VITALS — BP 122/70 | HR 84 | Temp 98.3°F | Resp 16 | Ht 62.0 in | Wt 163.0 lb

## 2016-06-02 DIAGNOSIS — Z9049 Acquired absence of other specified parts of digestive tract: Secondary | ICD-10-CM

## 2016-06-02 DIAGNOSIS — Z86010 Personal history of colonic polyps: Secondary | ICD-10-CM

## 2016-06-02 DIAGNOSIS — Z23 Encounter for immunization: Secondary | ICD-10-CM | POA: Diagnosis not present

## 2016-06-02 NOTE — Progress Notes (Signed)
  MRN: [id number] DOB: [birth year]  Subjective:   David Morales is a 61 y.o. male presenting for chief complaint of Follow-up (Wants a follow up colonoscopy, had a surgery in [place] to removed polyps LAST February) and Flu Vaccine  Reports that he had a colonic surgery in [place] February 2017. States that he had a portion of colon removed due to polyps. Denies fever, n/v, abdominal pain, bloody stools. He would like to have a referral scheduled for repeat colonoscopy and establish care with a GI doctor. He is agreeable to updating his flu shot today.  David Morales is not currently taking any medications. Also has No Known Allergies.  David Morales  has a past medical history of Hyperlipidemia and Hypertension. Also  has a past surgical history that includes Colon surgery and Colonoscopy.  Objective:   Vitals: BP 122/70   Pulse 84   Temp 98.3 F (36.8 C) (Oral)   Resp 16   Ht 5\' 2"  (1.575 m)   Wt 163 lb (73.9 kg)   SpO2 99%   BMI 29.81 kg/m   Physical Exam  Constitutional: He is oriented to person, place, and time. He appears well-developed and well-nourished.  Cardiovascular: Normal rate.   Pulmonary/Chest: Effort normal.  Abdominal: Soft. Bowel sounds are normal. He exhibits no distension and no mass. There is no tenderness. There is no guarding.  Well-healed vertical surgical scar.  Neurological: He is alert and oriented to person, place, and time.  Skin: Skin is warm and dry.   Assessment and Plan :   1. History of colonic polyps 2. History of partial colectomy - Will refer to GI doctor to establish care and repeat colonoscopy. Contact number is [phone] for [name], the patient's nephew and can communicate in [place]. Physical exam and HPI is reassuring for today's visit. Counseled on signs and symptoms warranting evaluation prior to his referral. Patient verbalized understanding. - Ambulatory referral to Gastroenterology  3. Need for prophylactic vaccination and inoculation against  influenza - Flu Vaccine QUAD 36+ mos IM  David Eagles, PA-C [facility] [phone] 06/02/2016  10:26 AM

## 2016-06-02 NOTE — Patient Instructions (Addendum)
Polyp ??i trng (Colon Polyps) Polyp la? nh??ng kh?i u c?a m bn trong c? th?. Polyp co? th? pha?t tri?n ?? nhi?u ch?, bao g?m ru?t gia? (??i trng). Polyp co? th? la? m?t kh?i u tro?n ho??c u co? hi?nh n?m. Quy? vi? co? th? co? m?t ho??c m?t va?i polyp. H?u h?t polyp khng pha?i la? ung th? (la?nh ti?nh). Tuy nhin, m?t s? polyp ??i trng co? th? d?n tr?? tha?nh ung th?. NGUYN NHN Nguyn nhn chnh xc c?a polyp ru?t k?t ch?a ???c bi?t ??n. CC Y?U T? NGUY C? Tnh tr?ng ny hay x?y ra h?n ? nh?ng ng??i:  Co? ti?n s?? gia ?i?nh bi? ung th? ??i trng ho??c polyp ??i trng.  H?n 50 [place]?i ho??c h?n 45 [place]?i n?u la? ng???i My? g?c Phi.  Co? b?nh vim ru?t, ch??ng ha?n nh? vim ?a?i tra?ng th? lot ho??c b?nh [place].  Bi? th??a cn.  Ht thu?c l.  Khng t?p th? du?c ??y ?u?Marland Kitchen  U?ng qu nhi?u r??u.  ?n m?t ch? ?? ?n:  Nhi?u ch?t be?o ho??c thi?t ?o?Marland Kitchen  I?t ch?t x?.  Bi? ung th? th??i th? ?u ?a? ????c ?i?u tri? b??ng tia xa? bu?ng. TRI?U CH?NG H?u h?t polyp khng gy tri?u ch??ng. N?u qu v? c tri?u ch?ng, cc tri?u ch?ng ? c th? bao g?m:  Ma?u cha?y ?? tr??c tra?ng khi ?i ?a?i ti?n.  C mu trong phn.Phn co? th? co? ma?u ?o? s?m ho??c ?en.  Thay ??i thi quen ?a?i ti?n, ch??ng ha?n nh? ta?o bo?n ho??c tiu cha?y. CH?N ?ON Tnh tr?ng ny ???c ch?n ?on b?ng cch soi ??i trng. ?y la? m?t thu? thu?t s?? du?ng m?t ?ng m?m co? ?e?n ?? xem bn trong ??i trng cu?a quy? vi?. ?I?U TR? Vi?c ?i?u tri? b?nh na?y bao g?m vi?c c??t bo? b?t ky? polyp na?o ????c ti?m th?y. Sau ?, nh??ng polyp ?o? se? ????c ki?m tra xem co? ung th? khng. N?u co? ung th?, chuyn gia ch?m so?c s??c kho?e cu?a quy? vi? se? no?i cho quy? vi? v? nh??ng l??a cho?n ?i?u tri? ung th? ??i trng. H??NG D?N CH?M [place] T?I NH Ch? ?? ?n  ?n nhi?u ch?t x? nh? tri cy, rau, ng? c?c nguyn ca?m.  ?n ca?c loa?i th??c ?n gia?u can xi va? vitamin D,  ch??ng ha?n nh? s??a, pho ma?t, s??a chua, tr??ng, gan, ca? va? bng ca?i xanh.  Ha?n ch? ca?c loa?i th??c ?n nhi?u ch?t be?o, thi?t ?o? va? thi?t ch? bi?n s??n, ch??ng ha?n nh? hot dog (ba?nh mi? ke?p xu?c xi?ch), xu?c xi?ch, thi?t xng kho?i va? cc lo?i thi?t ngu?i.  Duy tr m?c cn n?ng c l?i cho s?c kh?e ho?c gi?m cn n?u ????c chuyn gia ch?m [place] s?c kh?e khuy?n nghi?. H??ng d?n chung  Khng ht thu?c l.  Khng u?ng nhi?u r??u.  Tun th? t?t c? cc cu?c h?n khm l?i theo ch? d?n c?a chuyn gia ch?m [place] s?c kh?e. ?i?u ny c vai tr quan tr?ng. Vi?c na?y bao g?m duy tri? soi ru?t k?t ?i?nh ky? th???ng xuyn. Ha?y no?i chuy?n v??i chuyn gia ch?m so?c s??c kho?e xem khi na?o quy? vi? c?n soi ??i trng.  T?p th? du?c m?i ngy ho?c theo ch? d?n c?a chuyn gia ch?m [place] s?c kh?e. ?I KHM N?U:  Quy? vi? m?i bi? cha?y ma?u ho??c cha?y ma?u tr?m tr?ng h?n trong khi ?i ?a?i ti?n.  Qu v? m?i th?y co? ma?u ho??c co? ma?u nhi?u h?n trong phn.  Quy? vi? co? thay ??i  thi quen ?a?i ti?n.  Quy? vi? bi? su?t cn ma? khng ro? nguyn nhn. Thng tin ny khng nh?m m?c ?ch thay th? cho l?i khuyn m chuyn gia ch?m [place] s?c kh?e ni v?i qu v?. Hy b?o ??m qu v? ph?i th?o lu?n b?t k? v?n ?? g m qu v? c v?i chuyn gia ch?m [place] s?c kh?e c?a qu v?. Document Released: 09/12/2011 Document Revised: 07/05/2015 Document Reviewed: 02/01/2015 Elsevier Interactive Patient Education  2017 [place].     IF you received an x-ray today, you will receive an invoice from [facility]. Please contact [facility] at [phone] with questions or concerns regarding your invoice.   IF you received labwork today, you will receive an invoice from [place]. Please contact [facility] at [phone] with questions or concerns regarding your invoice.   Our billing staff will not be able to assist you with questions regarding bills from these companies.  You will be  contacted with the lab results as soon as they are available. The fastest way to get your results is to activate your My Chart account. Instructions are located on the last page of this paperwork. If you have not heard from [place] regarding the results in 2 weeks, please contact this office.

## 2016-06-09 ENCOUNTER — Encounter: Payer: Self-pay | Admitting: Urgent Care

## 2016-07-04 ENCOUNTER — Ambulatory Visit: Payer: BLUE CROSS/BLUE SHIELD

## 2016-07-11 ENCOUNTER — Ambulatory Visit: Payer: BLUE CROSS/BLUE SHIELD

## 2016-07-11 ENCOUNTER — Ambulatory Visit (INDEPENDENT_AMBULATORY_CARE_PROVIDER_SITE_OTHER): Payer: BLUE CROSS/BLUE SHIELD | Admitting: Physician Assistant

## 2016-07-11 VITALS — BP 138/80 | HR 101 | Temp 98.4°F | Resp 18 | Ht 62.0 in | Wt 162.6 lb

## 2016-07-11 DIAGNOSIS — Z86010 Personal history of colonic polyps: Secondary | ICD-10-CM

## 2016-07-11 NOTE — Progress Notes (Signed)
  07/11/2016 9:29 AM   DOB: January 29, 1956 / MRN: [id number]  SUBJECTIVE:  David Morales is a 61 y.o. male presenting for a referral to gastroenterology.  He has been referred multiple times already.  He has a history of adenocarcinoma of the colon and I had CT'd his abdomen in early 2017 showing a mass at the hepatic flexure.  I had tried to get him to GI at that time however he rushed back to [place] and had excisional surgery there. He tells me he has one non bloody stool daily.   He has No Known Allergies.   He  has a past medical history of Hyperlipidemia and Hypertension.    He  reports that he has quit smoking. He quit after 5.00 years of use. He has never used smokeless tobacco. He reports that he drinks alcohol. He reports that he does not use drugs. He  has no sexual activity history on file. The patient  has a past surgical history that includes Colon surgery and Colonoscopy.  His family history is not on file.  Review of Systems  Constitutional: Negative for chills, diaphoresis and fever.  Gastrointestinal: Negative for nausea.  Skin: Negative for rash.  Neurological: Negative for dizziness.    The problem list and medications were reviewed and updated by myself where necessary and exist elsewhere in the encounter.   OBJECTIVE:  BP 138/80   Pulse (!) 101   Temp 98.4 F (36.9 C) (Oral)   Resp 18   Ht 5\' 2"  (1.575 m)   Wt 162 lb 9.6 oz (73.8 kg)   SpO2 97%   BMI 29.74 kg/m   Physical Exam  Constitutional: He is active and cooperative.  Cardiovascular: Normal rate.   Pulmonary/Chest: Effort normal. No tachypnea.  Abdominal: Soft. Bowel sounds are normal. He exhibits no distension and no mass. There is no tenderness. There is no rebound and no guarding.    Neurological: He is alert.  Skin: Skin is warm.  Vitals reviewed.   No results found for this or any previous visit (from the past 72 hour(s)).  No results found.  ASSESSMENT AND PLAN:  David Morales was seen today for  follow-up.  Diagnoses and all orders for this visit:  Hx of adenomatous colonic polyps: He has been referred at least three times and has never gone.  We are going to call his friend whom [employer] has been granted. Hopefully he will be able to go.   -     Ambulatory referral to Gastroenterology    The patient is advised to call or return to clinic if he does not see an improvement in symptoms, or to seek the care of the closest emergency department if he worsens with the above plan.   Philis Fendt, MHS, PA-C [facility] 07/11/2016 9:29 AM

## 2016-07-11 NOTE — Patient Instructions (Signed)
     IF you received an x-ray today, you will receive an invoice from [facility]. Please contact [facility] at [phone] with questions or concerns regarding your invoice.   IF you received labwork today, you will receive an invoice from [facility]. Please contact [facility] at [phone] with questions or concerns regarding your invoice.   Our billing staff will not be able to assist you with questions regarding bills from these companies.  You will be contacted with the lab results as soon as they are available. The fastest way to get your results is to activate your My Chart account. Instructions are located on the last page of this paperwork. If you have not heard from us regarding the results in 2 weeks, please contact this office.     

## 2017-08-21 ENCOUNTER — Other Ambulatory Visit: Payer: Self-pay | Admitting: Gastroenterology

## 2017-08-21 DIAGNOSIS — R634 Abnormal weight loss: Secondary | ICD-10-CM

## 2017-08-21 DIAGNOSIS — Z86010 Personal history of colonic polyps: Secondary | ICD-10-CM

## 2017-08-30 ENCOUNTER — Ambulatory Visit
Admission: RE | Admit: 2017-08-30 | Discharge: 2017-08-30 | Disposition: A | Discharge status: Home / Self Care | Payer: BLUE CROSS/BLUE SHIELD | Source: Ambulatory Visit | Attending: Gastroenterology | Admitting: Gastroenterology

## 2017-08-30 DIAGNOSIS — Z86010 Personal history of colonic polyps: Secondary | ICD-10-CM

## 2017-08-30 DIAGNOSIS — R634 Abnormal weight loss: Secondary | ICD-10-CM

## 2017-08-30 MED ORDER — IOHEXOL 300 MG/ML  SOLN
100.0000 mL | Freq: Once | INTRAMUSCULAR | Status: AC | PRN
  Administered 2017-08-30: 100 mL via INTRAVENOUS

## 2017-08-31 ENCOUNTER — Telehealth: Payer: Self-pay

## 2017-08-31 NOTE — Telephone Encounter (Signed)
-----   Message from Tereasa Coop, PA-C sent at 08/31/2017  4:21 PM EDT ----- Regarding: FW: Low TSH   ----- Message ----- From: Otis Brace, MD Sent: 08/22/2017   8:28 AM To: Wardell Honour, MD, Tereasa Coop, PA-C Subject: Low TSH                                        Good Morning,   Mr. Tex Conroy was seen in the Office for follow-up for possible adenocarcinoma of the colon. He had colonoscopy last year which showed adenomatous polyp. Will be due for repeat colonoscopy in 2021.TSH was ordered because of weight loss which came back low at 0.03. CBC, CMP and CEA normal.  Please arrange for follow-up for further management of thyroid disease.  Appreciate your help.  Otis Brace, MD [facility]

## 2017-09-04 NOTE — Telephone Encounter (Signed)
Please schedule with [facility] for f/u on thyroid.

## 2017-09-05 NOTE — Telephone Encounter (Signed)
Tried to call patient no answer an no voicemail. If he calls back please schedule him

## 2018-01-19 IMAGING — CT CT ABD-PELV W/ CM
2 of 5 series · 15 of 46 positions shown, 17 images · IV contrast (APPLIED)
Comparison: None

CLINICAL DATA: Recent colonoscopy positive for adenocarcinoma 10
days ago, history hypertension, hyperlipidemia, former smoker ;
information via interpreter

EXAM:
CT ABDOMEN AND PELVIS WITH CONTRAST
TECHNIQUE: Multidetector CT imaging of the abdomen and pelvis was performed
using the standard protocol following bolus administration of
intravenous contrast. Sagittal and coronal MPR images reconstructed
from axial data set.
CONTRAST:  100mL OMNIPAQUE IOHEXOL 300 MG/ML SOLN IV. Dilute oral
contrast.

[Series 2: axial st · axial · 0.71mm/px · z∈[-464,-44]mm · 12 of 94 slices shown, 14 images]
[im 5/94  soft-tissue]
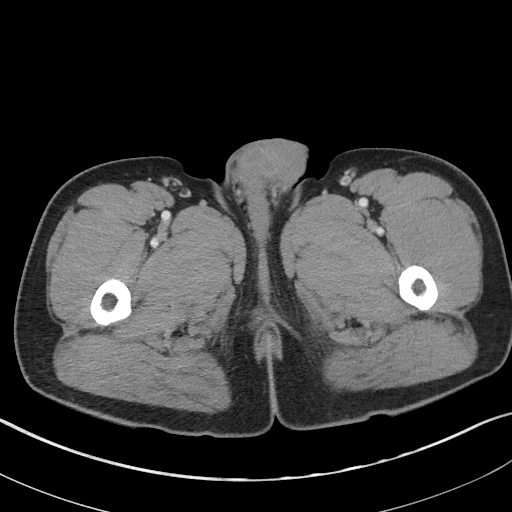
[im 5/94  bone]
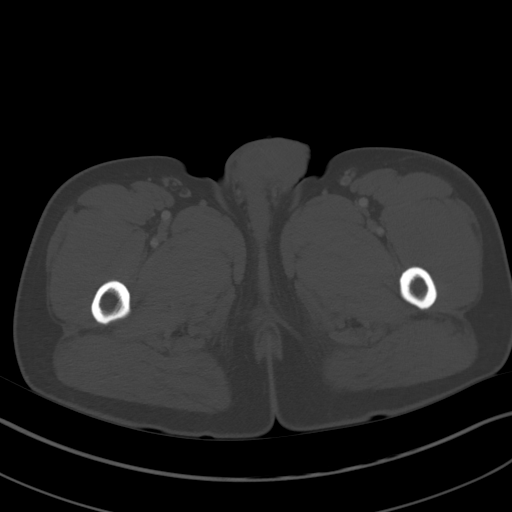
[im 14/94  soft-tissue]
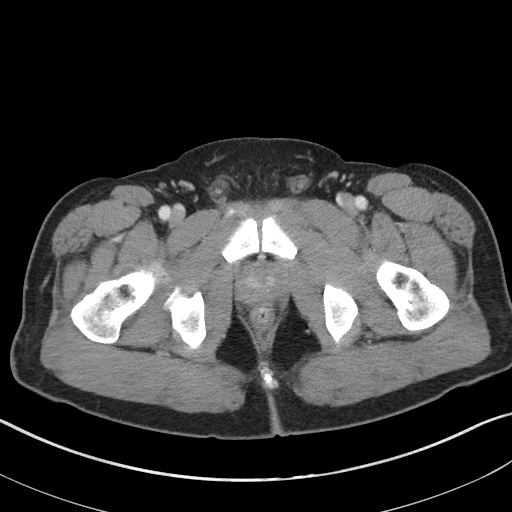
[im 23/94  soft-tissue]
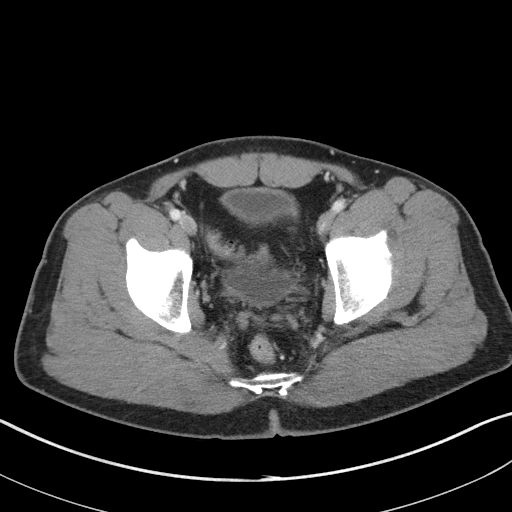
[im 27/94  soft-tissue]
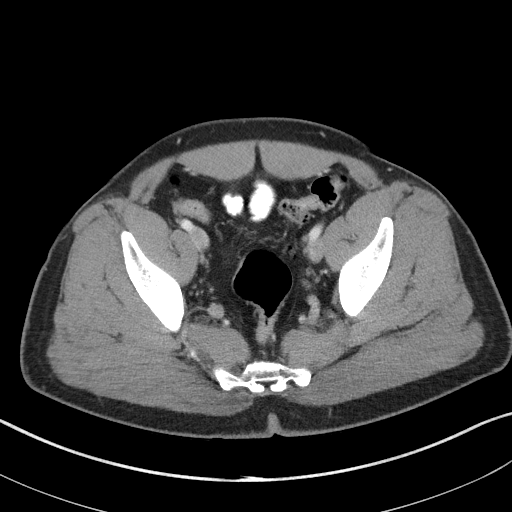
[im 36/94  soft-tissue]
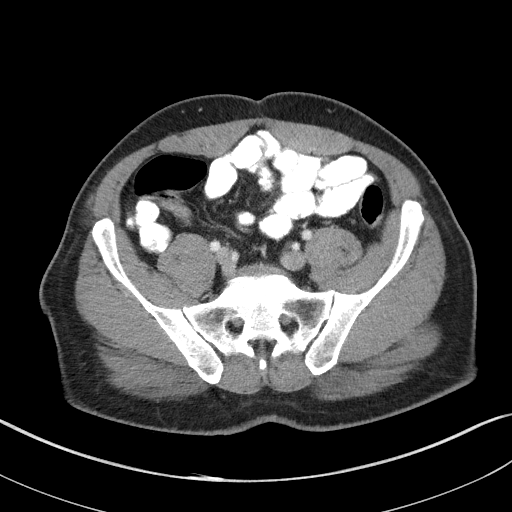
[im 45/94  soft-tissue]
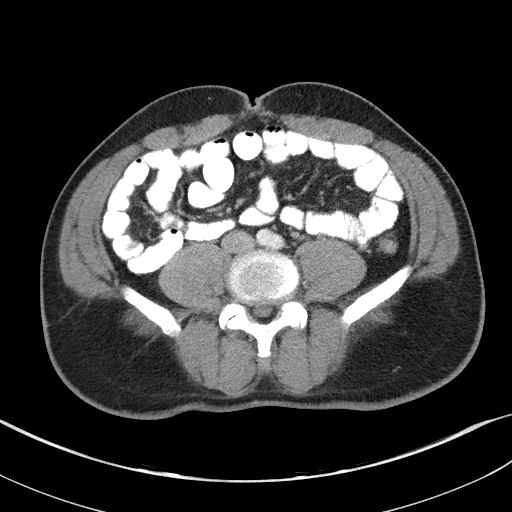
[im 49/94  soft-tissue]
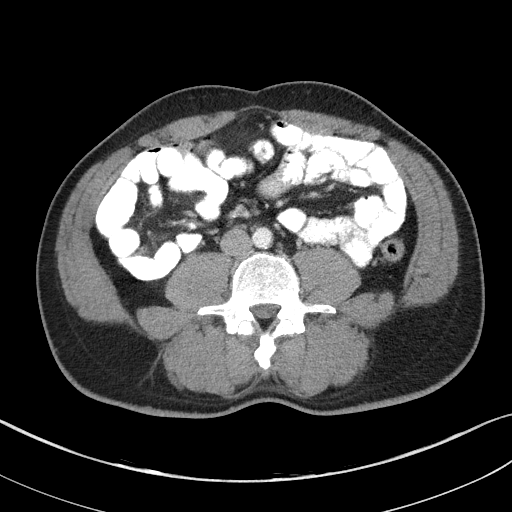
[im 58/94  soft-tissue]
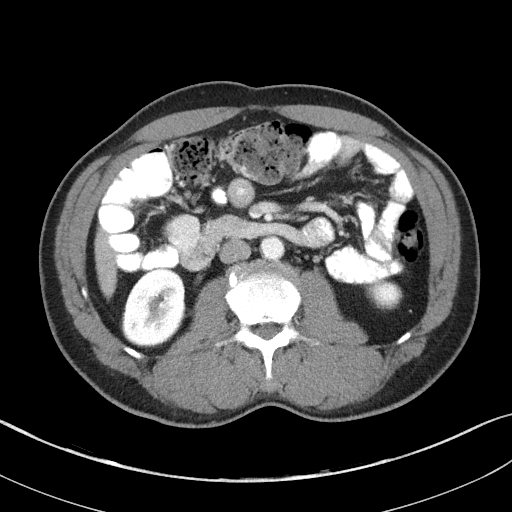
[im 67/94  soft-tissue]
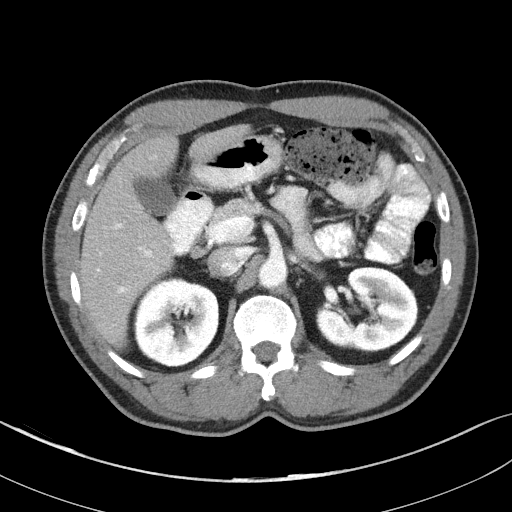
[im 67/94  bone]
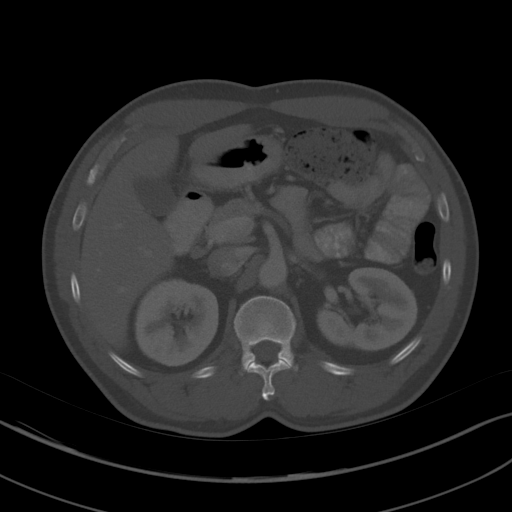
[im 71/94  soft-tissue]
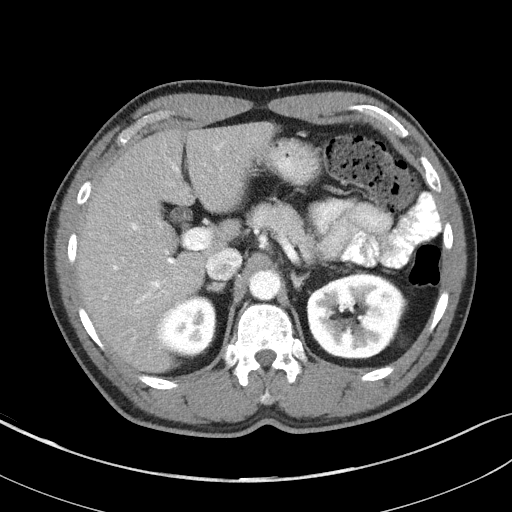
[im 80/94  soft-tissue]
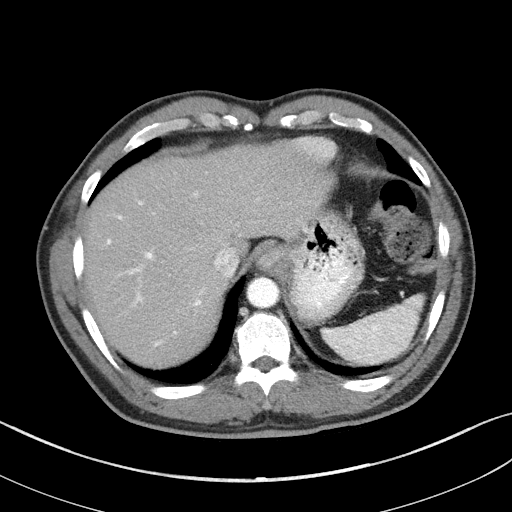
[im 89/94  soft-tissue]
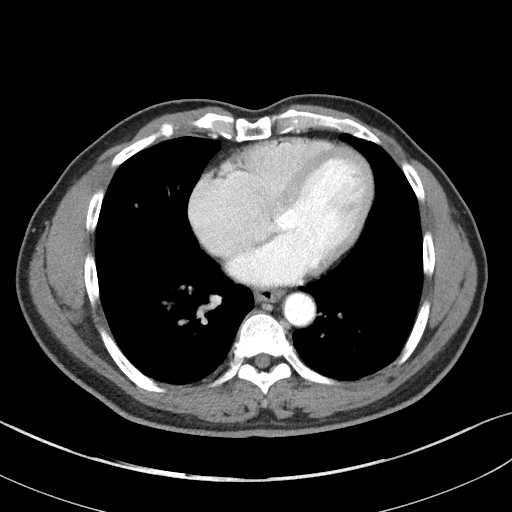

[Series 5: coronal st · coronal · 0.64mm/px · 3 of 85 slices shown]
[im 29/85  soft-tissue]
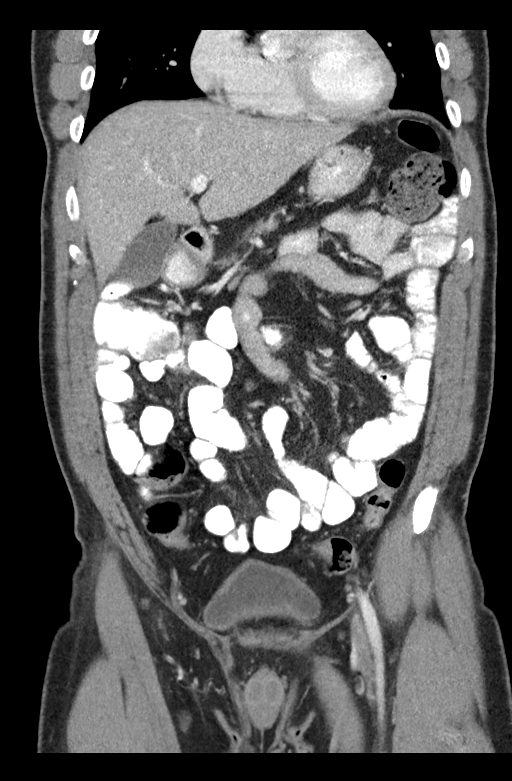
[im 38/85  soft-tissue]
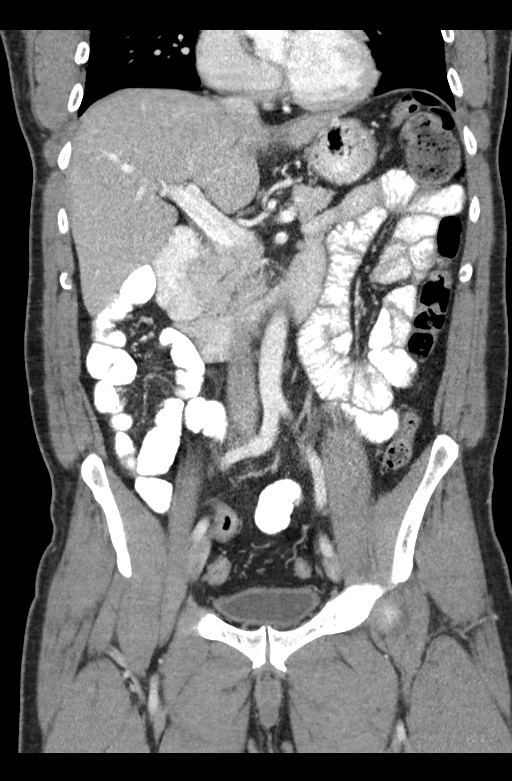
[im 47/85  soft-tissue]
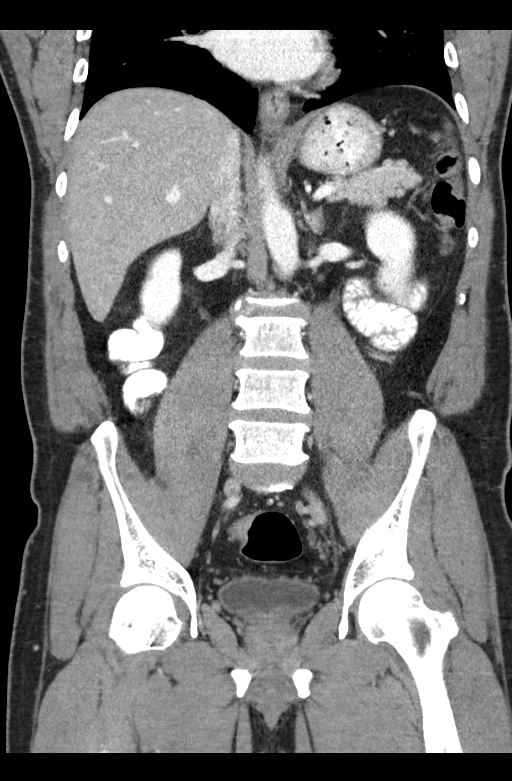

[15 of 46 positions shown; findings below may reference images not displayed]

FINDINGS: Lung bases clear.

Tiny BILATERAL renal cysts.

Liver, gallbladder, spleen, pancreas, kidneys, and adrenal glands
otherwise normal appearance.

Appendix not definitely visualized.

Bladder and ureters normal appearance.

Mid to distal colon is un opacified and under distended limiting
assessment.

Questionable mass lesion at hepatic flexure 4.2 x 2.5 cm image 42
concerning for neoplasm.

Remainder of colon grossly unremarkable.

Stomach and bowel loops normal appearance.

Question mild wall thickening of the distal esophagus versus small
collapsed hiatal hernia.

Minimal atherosclerotic calcification.

Few scattered normal size mesenteric lymph nodes RIGHT mid abdomen.

No additional mass, adenopathy, free fluid, free air, or hernia.

Avascular necrosis changes of both femoral heads without collapse.

No acute osseous findings otherwise seen.
IMPRESSION: Suspected soft tissue mass at hepatic flexure of colon 4.2 x 2.5 cm
in size concerning for colonic neoplasm ; recommend correlation with
colonoscopy results.

No definite evidence of metastatic disease.

Questionable mild wall thickening of the distal esophagus versus
small collapsed hiatal hernia.

## 2020-06-02 ENCOUNTER — Ambulatory Visit (HOSPITAL_COMMUNITY)
Admission: EM | Admit: 2020-06-02 | Discharge: 2020-06-02 | Disposition: A | Discharge status: Home / Self Care | Payer: 59 | Attending: Emergency Medicine | Admitting: Emergency Medicine

## 2020-06-02 ENCOUNTER — Other Ambulatory Visit: Payer: Self-pay

## 2020-06-02 ENCOUNTER — Encounter (HOSPITAL_COMMUNITY): Payer: Self-pay

## 2020-06-02 DIAGNOSIS — M79601 Pain in right arm: Secondary | ICD-10-CM | POA: Diagnosis not present

## 2020-06-02 DIAGNOSIS — R202 Paresthesia of skin: Secondary | ICD-10-CM

## 2020-06-02 DIAGNOSIS — G5601 Carpal tunnel syndrome, right upper limb: Secondary | ICD-10-CM

## 2020-06-02 HISTORY — DX: Polyp of colon: K63.5

## 2020-06-02 MED ORDER — PREDNISONE 10 MG (21) PO TBPK: ORAL_TABLET | Freq: Every day | ORAL | 0 refills | Status: DC

## 2020-06-02 MED ORDER — TIZANIDINE HCL 2 MG PO TABS: 2.0000 mg | ORAL_TABLET | Freq: Every day | ORAL | 0 refills | Status: DC

## 2020-06-02 NOTE — ED Provider Notes (Signed)
[place]    CSN: [id number] Arrival date & time: 06/02/20  1632      History   Chief Complaint Chief Complaint  Patient presents with  . Numbness  . Hand Pain    HPI David Morales is a 65 y.o. male.   David Morales presents with complaints of right middle finger tightness, numbness and pain. Worse when he first wakes up in the mornings. He feels like he has to massage his right middle finger in order to gain better range of motion of the finger.  Pain radiates up his arm to elbow and to neck. No specific injury. He has been feeling this for the past 5 months. He does repetitive motions while at work in a [place] type setting. History of carpal tunnel. He has had injections in the past which have been helpful. No redness swelling or warmth. Also requesting referral for colonoscopy.    Vietnamese video interpreter used to collect history and physical exam.    ROS per HPI, negative if not otherwise mentioned.      Past Medical History:  Diagnosis Date  . Colon polyps   . Hyperlipidemia   . Hypertension     There are no problems to display for this patient.   Past Surgical History:  Procedure Laterality Date  . COLON SURGERY    . COLONOSCOPY         Home Medications    Prior to Admission medications   Medication Sig Start Date End Date Taking? Authorizing Provider  predniSONE (STERAPRED UNI-PAK 21 TAB) 10 MG (21) TBPK tablet Take by mouth daily. Per box instruction 06/02/20  Yes Ajah Vanhoose, Lanelle Bal B, NP  tiZANidine (ZANAFLEX) 2 MG tablet Take 1 tablet (2 mg total) by mouth at bedtime. 06/02/20  Yes Zigmund Gottron, NP    Family History History reviewed. No pertinent family history.  Social History Social History   Tobacco Use  . Smoking status: Former Smoker    Years: 5.00  . Smokeless tobacco: Never Used  Substance Use Topics  . Alcohol use: Yes    Alcohol/week: 0.0 standard drinks  . Drug use: No     Allergies   Patient has no known  allergies.   Review of Systems Review of Systems   Physical Exam Triage Vital Signs ED Triage Vitals  Enc Vitals Group     BP 06/02/20 1657 134/85     Pulse Rate 06/02/20 1657 82     Resp 06/02/20 1657 18     Temp 06/02/20 1705 97.7 F (36.5 C)     Temp src --      SpO2 06/02/20 1657 94 %     Weight --      Height --      Head Circumference --      Peak Flow --      Pain Score --      Pain Loc --      Pain Edu? --      Excl. in [place]? --    No data found.  Updated Vital Signs BP 134/85   Pulse 82   Temp 97.7 F (36.5 C)   Resp 18   SpO2 94%   Visual Acuity Right Eye Distance:   Left Eye Distance:   Bilateral Distance:    Right Eye Near:   Left Eye Near:    Bilateral Near:     Physical Exam Constitutional:      Appearance: He is well-developed.  Cardiovascular:  Rate and Rhythm: Normal rate.  Pulmonary:     Effort: Pulmonary effort is normal.  Musculoskeletal:     Comments: strength equal bilaterally to upper extremities; gross sensation intact to right hand; positive tinel sign with tingling sensation to right hand middle finger; no elbow or shoulder joint tenderness; mild right trapezius tenderness on palpation, full neck ROM  Skin:    General: Skin is warm and dry.  Neurological:     Mental Status: He is alert and oriented to person, place, and time.      UC Treatments / Results  Labs (all labs ordered are listed, but only abnormal results are displayed) Labs Reviewed - No data to display  EKG   Radiology No results found.  Procedures Procedures (including critical care time)  Medications Ordered in UC Medications - No data to display  Initial Impression / Assessment and Plan / UC Course  I have reviewed the triage vital signs and the nursing notes.  Pertinent labs & imaging results that were available during my care of the patient were reviewed by me and considered in my medical decision making (see chart for details).      Repetitive work likely contributing to muscle spasms and what appears consistent with carpal tunnel. Wrist brace provided for right wrist. Prednisone pack and muscle relaxer. Follow up recommendations provided, encouraged to establish with a primary care provider. Patient verbalized understanding and agreeable to plan.   Final Clinical Impressions(s) / UC Diagnoses   Final diagnoses:  Arm pain, right  Carpal tunnel syndrome of right wrist  Paresthesias     Discharge Instructions     Prednisone pack to help with pain from your neck and wrist.  Wear brace, especially working and sleeping.  Muscle relaxer at night. May cause drowsiness. Please do not take if driving or drinking alcohol.   Please establish with a primary care provider for referral to GI for colonoscopy.  Please follow up with sports medicine as needed if your arm pain persists.    Gi Prednisone ?? gip gi?m ?au c? v c? tay c?a b?n. Mang n?p, ??c bi?t l lm vi?c v ng?. Thu?c gin c? vo ban ?m. C th? gy ra bu?n ng?[place] lng khng mang theo n?u ?ang li xe ho?c u?ng r??u. Vui lng lin h? v?i nh cung c?p d?ch v? ch?m [facility] [facility] ?? ???c gi?i thi?u ??n GI ?? n?i soi. Vui lng ti khm v?i thu?c th? thao khi c?n thi?t n?u tnh tr?ng ?au cnh tay c?a b?n v?n cn.   ED Prescriptions    Medication Sig Dispense Auth. Provider   tiZANidine (ZANAFLEX) 2 MG tablet Take 1 tablet (2 mg total) by mouth at bedtime. 20 tablet Augusto Gamble B, NP   predniSONE (STERAPRED UNI-PAK 21 TAB) 10 MG (21) TBPK tablet Take by mouth daily. Per box instruction 21 tablet Zigmund Gottron, NP     PDMP not reviewed this encounter.   Augusto Gamble B, NP 06/02/20 1800

## 2020-06-02 NOTE — ED Triage Notes (Addendum)
Pt in with c/o right hand numbness and pain that has been going on for a few days. States he has to massage his hands to help with sxs  Also states he needs a referral for a colonoscopy because he's had masses removed in the past and feels he needs to go back every year

## 2020-06-02 NOTE — Discharge Instructions (Addendum)
Prednisone pack to help with pain from your neck and wrist.  Wear brace, especially working and sleeping.  Muscle relaxer at night. May cause drowsiness. Please do not take if driving or drinking alcohol.   Please establish with a primary care provider for referral to GI for colonoscopy.  Please follow up with sports medicine as needed if your arm pain persists.    Gi Prednisone ?? gip gi?m ?au c? v c? tay c?a b?n. Mang n?p, ??c bi?t l lm vi?c v ng?. Thu?c gin c? vo ban ?m. C th? gy ra bu?n ng?[place] lng khng mang theo n?u ?ang li xe ho?c u?ng r??u. Vui lng lin h? v?i nh cung c?p d?ch v? ch?m [place] [facility] ?? ???c gi?i thi?u ??n GI ?? n?i soi. Vui lng ti khm v?i thu?c th? thao khi c?n thi?t n?u tnh tr?ng ?au cnh tay c?a b?n v?n cn.

## 2020-06-23 ENCOUNTER — Ambulatory Visit (INDEPENDENT_AMBULATORY_CARE_PROVIDER_SITE_OTHER): Payer: 59 | Admitting: Family Medicine

## 2020-06-23 ENCOUNTER — Other Ambulatory Visit: Payer: Self-pay

## 2020-06-23 VITALS — BP 144/82 | Ht 63.39 in | Wt 140.0 lb

## 2020-06-23 DIAGNOSIS — M5412 Radiculopathy, cervical region: Secondary | ICD-10-CM

## 2020-06-23 DIAGNOSIS — R200 Anesthesia of skin: Secondary | ICD-10-CM

## 2020-06-23 MED ORDER — DICLOFENAC SODIUM 75 MG PO TBEC: 75.0000 mg | DELAYED_RELEASE_TABLET | Freq: Two times a day (BID) | ORAL | 1 refills | Status: DC

## 2020-06-23 NOTE — Patient Instructions (Signed)
We will set you up to see neurology and have the nerve test of your arms. Follow up with me after this to go over results and next steps. Take diclofenac twice a day with food for pain and inflammation. Ok to take tylenol with this. Wear the wrist braces at nighttime when you sleep.

## 2020-06-24 ENCOUNTER — Encounter: Payer: Self-pay | Admitting: Family Medicine

## 2020-06-24 NOTE — Progress Notes (Signed)
PCP: Patient, No Pcp Per (Inactive)  Subjective:   HPI: Patient is a 65 y.o. male here for bilateral arm numbness.  Patient seen with interpreter. He reports for several months he's had feeling of numbness throughout both hands. Worse in the morning and at work where he does a lot of repetitive motions of hands. He's also reporting numbness, pain that goes from neck down into both upper extremities. No bowel/bladder dysfunction. Reported having injections in past but unsure where these were given. Oral prednisone from urgent care did not provide benefit.  Past Medical History:  Diagnosis Date  . Colon polyps   . Hyperlipidemia   . Hypertension     Current Outpatient Medications on File Prior to Visit  Medication Sig Dispense Refill  . predniSONE (STERAPRED UNI-PAK 21 TAB) 10 MG (21) TBPK tablet Take by mouth daily. Per box instruction 21 tablet 0  . tiZANidine (ZANAFLEX) 2 MG tablet Take 1 tablet (2 mg total) by mouth at bedtime. 20 tablet 0   No current facility-administered medications on file prior to visit.    Past Surgical History:  Procedure Laterality Date  . COLON SURGERY    . COLONOSCOPY      No Known Allergies  Social History   Socioeconomic History  . Marital status: Married    Spouse name: Not on file  . Number of children: Not on file  . Years of education: Not on file  . Highest education level: Not on file  Occupational History  . Not on file  Tobacco Use  . Smoking status: Former Smoker    Years: 5.00  . Smokeless tobacco: Never Used  Substance and Sexual Activity  . Alcohol use: Yes    Alcohol/week: 0.0 standard drinks  . Drug use: No  . Sexual activity: Not on file  Other Topics Concern  . Not on file  Social History Narrative  . Not on file   Social Determinants of Health   Financial Resource Strain: Not on file  Food Insecurity: Not on file  Transportation Needs: Not on file  Physical Activity: Not on file  Stress: Not on file   Social Connections: Not on file  Intimate Partner Violence: Not on file    History reviewed. No pertinent family history.  BP (!) 144/82   Ht 5' 3.39" (1.61 m)   Wt 140 lb (63.5 kg)   BMI 24.50 kg/m   No flowsheet data found.  No flowsheet data found.  Review of Systems: See HPI above.     Objective:  Physical Exam:  Gen: NAD, comfortable in exam room  Neck: No gross deformity, swelling, bruising. No paraspinal TTP.  No midline/bony TTP. Extension only to 10 degrees.  Full motion otherwise. BUE strength 5/5.   Sensation intact to light touch.   Trace equal reflexes in triceps, biceps, brachioradialis tendons. Negative spurlings.  Bilateral wrists/hands: No deformity, atrophy. FROM with 5/5 strength. No tenderness to palpation. NVI distally. Negative tinels, phalens.   Limited MSK u/s: R median nerve area 0.18cm2, L 0.15cm2  Assessment & Plan:  1. Bilateral upper extremity numbness - patient without red flag symptoms.  Uncertain if this is carpal tunnel or radiculopathy given distribution.  Ultrasound, his early history would suggest bilateral carpal tunnel though also describes symptoms from neck down both arms.  Will go ahead with neurology referral for evaluation and probable NCV/EMGs - plan to follow up after this to go over next steps.  Diclofenac with tylenol as needed.  Wrist braces  at night.

## 2020-10-01 ENCOUNTER — Ambulatory Visit: Payer: 59 | Admitting: Diagnostic Neuroimaging

## 2020-10-01 ENCOUNTER — Encounter: Payer: Self-pay | Admitting: Diagnostic Neuroimaging

## 2020-10-01 VITALS — BP 131/83 | HR 100 | Ht 64.0 in | Wt 158.0 lb

## 2020-10-01 DIAGNOSIS — R200 Anesthesia of skin: Secondary | ICD-10-CM | POA: Diagnosis not present

## 2020-10-01 DIAGNOSIS — R29898 Other symptoms and signs involving the musculoskeletal system: Secondary | ICD-10-CM

## 2020-10-01 DIAGNOSIS — M542 Cervicalgia: Secondary | ICD-10-CM | POA: Diagnosis not present

## 2020-10-01 NOTE — Progress Notes (Signed)
[facility]  PATIENT: David Morales DOB: [birth year]  REFERRING CLINICIAN: Dene Gentry, MD HISTORY FROM: patient (via daughter who interprets) REASON FOR VISIT: new consult    HISTORICAL  CHIEF COMPLAINT:  Chief Complaint  Patient presents with   New Patient (Initial Visit)    Rm 6, Pt with daughter who interprets for him. About 2-3 yrs ago developed numbness/tingling and pain in BUE and has gradually worsened. States worse in right. He has not completed imaging or NCV    HISTORY OF PRESENT ILLNESS:   65 year old right-handed male here for evaluation of neck pain, arm pain, numbness and tingling.  Symptoms started about 2 to 3 years ago.  He has severe pain in his left neck and shoulder with symptoms radiating to his bilateral arms, hands and fingers.  Patient went to sports medicine clinic and was referred here for further evaluation.  No prodromal accidents injuries or trauma.  Patient has been using some over-the-counter medicines with mild relief.    REVIEW OF SYSTEMS: Full 14 system review of systems performed and negative with exception of: as per HPI.   ALLERGIES: No Known Allergies  HOME MEDICATIONS: Outpatient Medications Prior to Visit  Medication Sig Dispense Refill   diclofenac (VOLTAREN) 75 MG EC tablet Take 1 tablet (75 mg total) by mouth 2 (two) times daily. 60 tablet 1   predniSONE (STERAPRED UNI-PAK 21 TAB) 10 MG (21) TBPK tablet Take by mouth daily. Per box instruction 21 tablet 0   tiZANidine (ZANAFLEX) 2 MG tablet Take 1 tablet (2 mg total) by mouth at bedtime. 20 tablet 0   No facility-administered medications prior to visit.    PAST MEDICAL HISTORY: Past Medical History:  Diagnosis Date   Colon polyps    Hyperlipidemia    Hypertension     PAST SURGICAL HISTORY: Past Surgical History:  Procedure Laterality Date   COLON SURGERY     COLONOSCOPY      FAMILY HISTORY: No family history on file.  SOCIAL HISTORY: Social  History   Socioeconomic History   Marital status: Married    Spouse name: Not on file   Number of children: Not on file   Years of education: Not on file   Highest education level: Not on file  Occupational History   Not on file  Tobacco Use   Smoking status: Every Day    Years: 5.00    Pack years: 0.00    Types: Cigarettes   Smokeless tobacco: Never  Substance and Sexual Activity   Alcohol use: Yes    Comment: occasional   Drug use: No   Sexual activity: Not on file  Other Topics Concern   Not on file  Social History Narrative   Not on file   Social Determinants of Health   Financial Resource Strain: Not on file  Food Insecurity: Not on file  Transportation Needs: Not on file  Physical Activity: Not on file  Stress: Not on file  Social Connections: Not on file  Intimate Partner Violence: Not on file     PHYSICAL EXAM  GENERAL EXAM/CONSTITUTIONAL: Vitals:  Vitals:   10/01/20 0857  BP: 131/83  Pulse: 100  Weight: 158 lb (71.7 kg)  Height: 5\' 4"  (1.626 m)   Body mass index is 27.12 kg/m. Wt Readings from Last 3 Encounters:  10/01/20 158 lb (71.7 kg)  06/23/20 140 lb (63.5 kg)  07/11/16 162 lb 9.6 oz (73.8 kg)   Patient is in no distress; well developed,  nourished and groomed; DECR ROM IN NECK; PAIN IN LEFT NECK WITH HEAD FLEXION  CARDIOVASCULAR: Examination of carotid arteries is normal; no carotid bruits Regular rate and rhythm, no murmurs Examination of peripheral vascular system by observation and palpation is normal  EYES: Ophthalmoscopic exam of optic discs and posterior segments is normal; no papilledema or hemorrhages No results found.  MUSCULOSKELETAL: Gait, strength, tone, movements noted in Neurologic exam below  NEUROLOGIC: MENTAL STATUS:  No flowsheet data found. awake, alert, oriented to person, place and time recent and remote memory intact normal attention and concentration language fluent, comprehension intact, naming  intact fund of knowledge appropriate  CRANIAL NERVE:  2nd - no papilledema on fundoscopic exam 2nd, 3rd, 4th, 6th - pupils equal and reactive to light, visual fields full to confrontation, extraocular muscles intact, no nystagmus 5th - facial sensation symmetric 7th - facial strength symmetric 8th - hearing intact 9th - palate elevates symmetrically, uvula midline 11th - shoulder shrug symmetric 12th - tongue protrusion midline  MOTOR:  normal bulk and tone, full strength in the BUE, BLE; EXCEPT DELTOID 4+. TRICEPS 3-4, GRIP 4  SENSORY:  normal and symmetric to light touch, pinprick, temperature, vibration; EXCEPT DECR PP IN BILATERAL ARMS AND HANDS  COORDINATION:  finger-nose-finger, fine finger movements normal  REFLEXES:  deep tendon reflexes TRACE IN BUE; ABSENT IN BLE  GAIT/STATION:  narrow based gait     DIAGNOSTIC DATA (LABS, IMAGING, TESTING) - I reviewed patient records, labs, notes, testing and imaging myself where available.  Lab Results  Component Value Date   WBC 8.1 05/13/2015   HGB 15.6 05/13/2015   HCT 44.3 05/13/2015   MCV 90.8 05/13/2015      Component Value Date/Time   NA 138 05/13/2015 1228   K 3.8 05/13/2015 1228   CL 106 05/13/2015 1228   CO2 25 05/13/2015 1228   GLUCOSE 120 (H) 05/13/2015 1228   BUN 15 05/13/2015 1228   CREATININE 0.85 05/13/2015 1228   CALCIUM 9.5 05/13/2015 1228   PROT 7.0 05/13/2015 1228   ALBUMIN 4.2 05/13/2015 1228   AST 21 05/13/2015 1228   ALT 24 05/13/2015 1228   ALKPHOS 58 05/13/2015 1228   BILITOT 0.6 05/13/2015 1228   GFRNONAA >89 05/13/2015 1228   GFRAA >89 05/13/2015 1228   No results found for: CHOL, HDL, LDLCALC, LDLDIRECT, TRIG, CHOLHDL Lab Results  Component Value Date   HGBA1C 5.7 08/11/2014   No results found for: VITAMINB12 No results found for: TSH     ASSESSMENT AND PLAN  65 y.o. year old male here with:  Dx:  1. Bilateral hand numbness   2. Neck pain   3. 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    Penni Bombard, MD 05/02/2033, 5:97 AM Certified in Neurology, Neurophysiology and Neuroimaging  [facility] [address]  [place], [place] [address] [phone]

## 2020-10-01 NOTE — Patient Instructions (Signed)
-   check MRI cervical spine and labs  - use ibuprofen, tylenol, aleve as needed  - may try ice / heat packs  - try over the counter topical creams --> diclofenac gel, lidocaine, icy-hot, aspercreme

## 2020-10-02 LAB — CBC WITH DIFFERENTIAL/PLATELET
Basophils Absolute: 0.1 10*3/uL (ref 0.0–0.2)
Basos: 1 %
EOS (ABSOLUTE): 0.6 10*3/uL — ABNORMAL HIGH (ref 0.0–0.4)
Eos: 11 %
Hematocrit: 46.1 % (ref 37.5–51.0)
Hemoglobin: 15.3 g/dL (ref 13.0–17.7)
Immature Grans (Abs): 0 10*3/uL (ref 0.0–0.1)
Immature Granulocytes: 0 %
Lymphocytes Absolute: 1.9 10*3/uL (ref 0.7–3.1)
Lymphs: 31 %
MCH: 31.2 pg (ref 26.6–33.0)
MCHC: 33.2 g/dL (ref 31.5–35.7)
MCV: 94 fL (ref 79–97)
Monocytes Absolute: 0.6 10*3/uL (ref 0.1–0.9)
Monocytes: 10 %
Neutrophils Absolute: 2.7 10*3/uL (ref 1.4–7.0)
Neutrophils: 47 %
Platelets: 260 10*3/uL (ref 150–450)
RBC: 4.9 x10E6/uL (ref 4.14–5.80)
RDW: 11.8 % (ref 11.6–15.4)
WBC: 5.9 10*3/uL (ref 3.4–10.8)

## 2020-10-02 LAB — TSH: TSH: 1.05 u[IU]/mL (ref 0.450–4.500)

## 2020-10-02 LAB — COMPREHENSIVE METABOLIC PANEL
ALT: 38 IU/L (ref 0–44)
AST: 27 IU/L (ref 0–40)
Albumin/Globulin Ratio: 1.8 (ref 1.2–2.2)
Albumin: 4.6 g/dL (ref 3.8–4.8)
Alkaline Phosphatase: 63 IU/L (ref 44–121)
BUN/Creatinine Ratio: 23 (ref 10–24)
BUN: 20 mg/dL (ref 8–27)
Bilirubin Total: 0.3 mg/dL (ref 0.0–1.2)
CO2: 20 mmol/L (ref 20–29)
Calcium: 9.6 mg/dL (ref 8.6–10.2)
Chloride: 101 mmol/L (ref 96–106)
Creatinine, Ser: 0.86 mg/dL (ref 0.76–1.27)
Globulin, Total: 2.5 g/dL (ref 1.5–4.5)
Glucose: 178 mg/dL — ABNORMAL HIGH (ref 65–99)
Potassium: 4.4 mmol/L (ref 3.5–5.2)
Sodium: 138 mmol/L (ref 134–144)
Total Protein: 7.1 g/dL (ref 6.0–8.5)
eGFR: 96 mL/min/{1.73_m2} (ref >59)

## 2020-10-02 LAB — HEMOGLOBIN A1C
Est. average glucose Bld gHb Est-mCnc: 143 mg/dL
Hgb A1c MFr Bld: 6.6 % — ABNORMAL HIGH (ref 4.8–5.6)

## 2020-10-02 LAB — VITAMIN B12: Vitamin B-12: 694 pg/mL (ref 232–1245)

## 2020-10-05 ENCOUNTER — Telehealth: Payer: Self-pay

## 2020-10-05 NOTE — Telephone Encounter (Signed)
-----   Message from Penni Bombard, MD sent at 10/05/2020  4:02 PM EDT ----- Glucose and A1c are higher now (in diabetes range). Needs PCP follow up. -VRP

## 2020-10-05 NOTE — Telephone Encounter (Signed)
I called interpreter service line and was connected with [facility] ID # [phone]. She placed call to pt and vm was not set up for home # and mobile # received error message. Will try again at a later time.

## 2020-10-06 NOTE — Telephone Encounter (Signed)
I called interpreter service for the 2nd time, spoke with [facility] ID # [phone]. We attempted to reach the pt and received message of VM not being set up yet.  Will mail letter advising pt of results since this was the second attempt and vm was not set up at either time.

## 2020-10-13 ENCOUNTER — Telehealth: Payer: Self-pay | Admitting: Diagnostic Neuroimaging

## 2020-10-17 ENCOUNTER — Ambulatory Visit
Admission: RE | Admit: 2020-10-17 | Discharge: 2020-10-17 | Disposition: A | Discharge status: Home / Self Care | Payer: 59 | Source: Ambulatory Visit | Attending: Diagnostic Neuroimaging | Admitting: Diagnostic Neuroimaging

## 2020-10-17 DIAGNOSIS — M542 Cervicalgia: Secondary | ICD-10-CM

## 2020-10-17 DIAGNOSIS — R200 Anesthesia of skin: Secondary | ICD-10-CM

## 2020-10-17 DIAGNOSIS — R29898 Other symptoms and signs involving the musculoskeletal system: Secondary | ICD-10-CM

## 2020-11-03 ENCOUNTER — Telehealth: Payer: Self-pay

## 2020-11-03 NOTE — Telephone Encounter (Signed)
Tried to contact pt daughter regarding MRI brain, no VM available

## 2020-11-03 NOTE — Telephone Encounter (Signed)
-----   Message from Penni Bombard, MD sent at 11/03/2020  3:24 PM EDT ----- Arthritis and degen changes in neck; consider PT eval; could consider spine surg consult / pain mgmt consult. Contact daughter with plan (can interpret for patient).-VRP

## 2020-11-04 DIAGNOSIS — R200 Anesthesia of skin: Secondary | ICD-10-CM

## 2020-11-04 DIAGNOSIS — M542 Cervicalgia: Secondary | ICD-10-CM

## 2020-11-04 DIAGNOSIS — R29898 Other symptoms and signs involving the musculoskeletal system: Secondary | ICD-10-CM

## 2020-11-04 NOTE — Telephone Encounter (Signed)
Tried to contact pt daughter again, no answer or VM available. Sent pt a Pharmacist, community message regarding results.

## 2020-11-08 NOTE — Telephone Encounter (Signed)
What do you advise?

## 2020-11-09 NOTE — Telephone Encounter (Signed)
Pt would like referral for pain

## 2020-11-16 ENCOUNTER — Telehealth: Payer: Self-pay

## 2020-11-16 NOTE — Telephone Encounter (Signed)
Orders Placed This Encounter  Procedures   Ambulatory referral to Neurosurgery    Penni Bombard, MD [id number], [id number] PM Certified in Neurology, Neurophysiology and Neuroimaging  [facility] [address], [place], [place] [address] [phone]

## 2020-11-16 NOTE — Telephone Encounter (Signed)
Referral for neurosurgery sent to [facility]. P: [phone].

## 2020-11-25 ENCOUNTER — Other Ambulatory Visit: Payer: Self-pay

## 2020-11-25 ENCOUNTER — Ambulatory Visit (INDEPENDENT_AMBULATORY_CARE_PROVIDER_SITE_OTHER): Payer: 59 | Admitting: Family Medicine

## 2020-11-25 ENCOUNTER — Encounter (HOSPITAL_BASED_OUTPATIENT_CLINIC_OR_DEPARTMENT_OTHER): Payer: Self-pay | Admitting: Family Medicine

## 2020-11-25 DIAGNOSIS — M4802 Spinal stenosis, cervical region: Secondary | ICD-10-CM | POA: Diagnosis not present

## 2020-11-25 DIAGNOSIS — K635 Polyp of colon: Secondary | ICD-10-CM

## 2020-11-25 DIAGNOSIS — E119 Type 2 diabetes mellitus without complications: Secondary | ICD-10-CM

## 2020-11-25 MED ORDER — METFORMIN HCL 500 MG PO TABS: ORAL_TABLET | ORAL | 1 refills | Status: DC

## 2020-11-25 NOTE — Assessment & Plan Note (Signed)
Reports having appointment arranged with neurosurgery next week.  Encouraged to continue with this given ongoing symptoms and findings on MRI

## 2020-11-25 NOTE — Assessment & Plan Note (Addendum)
New diagnosis for patient, hemoglobin A1c at 6.6% which is at goal Discussed treatment options, will proceed with metformin, discussed gradual increase in dosage in order to minimize GI side effects Discussed lifestyle modifications, will also refer to diabetic educator At next visit, complete foot exam, microalbumin creatinine ratio, discuss pneumonia vaccine recommendation, check lipid panel

## 2020-11-25 NOTE — Assessment & Plan Note (Signed)
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  Reportedly had resection while in [place] in 2017. Reports having recent colonoscopy in 2021 by GI provider in [place].  He is not sure of the name of the provider or office location.  Recommend that he try to obtain this information by reviewing prior records and letting us know so that we can request records and better assess need for future follow-up.  Patient voiced understanding

## 2020-11-25 NOTE — Progress Notes (Signed)
New Patient Office Visit  Subjective:  Patient ID: David Morales, male    DOB: [birth year]  Age: 65 y.o. MRN: [id number]  CC:  Chief Complaint  Patient presents with   Establish Care   Diabetes    Patient states he has diabetes and numbness in both hands.    HPI [name] is a 65 year old male presenting to establish in clinic.  Patient's primary language is [place], he is accompanied today by medical interpreter. Current concerns today are related to new diagnosis of diabetes.  Patient also with past medical history of cervical bilateral foraminal stenosis which is symptomatic and causing bilateral upper extremity numbness.  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  Diabetes: On recent evaluation with neurology, labs were completed and A1c was found to be elevated at 6.6%.  As result, he was advised to establish with primary care physician for further management.  He does report some issues of polyuria and polydipsia.  No visual changes.  Bilateral hand numbness: Has been evaluated with sports medicine provider, neurology and has had MRI of the cervical spine completed.  MRI revealed that there was bilateral foraminal stenosis which is likely the cause of his current symptoms.  He has been referred to neurosurgery by neurology.  Adenocarcinoma of the colon: On review of records, patient has reported history of adenocarcinoma of the colon and had a CT of his abdomen in 2017 which showed soft tissue mass at hepatic flexure of colon which was concerning for colonic neoplasm.  He was referred to GI, however it seems patient returned to [place] and reportedly had excisional surgery completed there.  Patient does split time between here and [place].  Plans to travel to [place] again in a little bit over 1 month and will be gone for about a 2 to 33-monthperiod.  Past Medical History:  Diagnosis Date   Colon polyps    Diabetes mellitus [place]     Hyperlipidemia    Hypertension     Past Surgical History:  Procedure Laterality Date   COLON SURGERY     COLONOSCOPY      History reviewed. No pertinent family history.  Social History   Socioeconomic History   Marital status: Married    Spouse name: Not on file   Number of children: Not on file   Years of education: Not on file   Highest education level: Not on file  Occupational History   Occupation: painter  Tobacco Use   Smoking status: Former    Packs/day: 0.25    Years: 11.00    Pack years: 2.75    Types: Cigarettes    Quit date: 10/25/2020    Years since quitting: 0.0   Smokeless tobacco: Never  Vaping Use   Vaping Use: Never used  Substance and Sexual Activity   Alcohol use: Yes    Comment: occasional   Drug use: No   Sexual activity: Not on file  Other Topics Concern   Not on file  Social History Narrative   Not on file   Social Determinants of Health   Financial Resource Strain: Not on file  Food Insecurity: Not on file  Transportation Needs: Not on file  Physical Activity: Not on file  Stress: Not on file  Social Connections: Not on file  Intimate Partner Violence: Not on file    Objective:   Today's Vitals: BP 114/71   Pulse 98   Ht '5\' 5"'$  (1.651 m)  Wt 151 lb 6.4 oz (68.7 kg)   SpO2 98%   BMI 25.19 kg/m   Physical Exam  65 year old male in no acute distress Cardiovascular exam with regular rate and rhythm, no murmurs appreciated Lungs clear to auscultation bilaterally  Assessment & Plan:   Problem List Items Addressed This Visit       Digestive   Colon polyps    Patient history, patient with polyp/adenocarcinoma of the colon near hepatic flexure.  Reportedly had resection while in [place] in 2017. Reports having recent colonoscopy in 2021 by GI provider in [place].  He is not sure of the name of the provider or office location.  Recommend that he try to obtain this information by reviewing prior records and letting us know  so that we can request records and better assess need for future follow-up.  Patient voiced understanding        Endocrine   Diabetes [facility])    New diagnosis for patient, hemoglobin A1c at 6.6% which is at goal Discussed treatment options, will proceed with metformin, discussed gradual increase in dosage in order to minimize GI side effects Discussed lifestyle modifications, will also refer to diabetic educator At next visit, complete foot exam, microalbumin creatinine ratio, discuss pneumonia vaccine recommendation, check lipid panel      Relevant Medications   metFORMIN (GLUCOPHAGE) 500 MG tablet     Musculoskeletal and Integument   Foraminal stenosis of cervical region    Reports having appointment arranged with neurosurgery next week.  Encouraged to continue with this given ongoing symptoms and findings on MRI       Outpatient Encounter Medications as of 11/25/2020  Medication Sig   metFORMIN (GLUCOPHAGE) 500 MG tablet Take 1 tablet by mouth twice daily for 2 weeks.  After 2 weeks, increase to 2 tablets by mouth in the morning and 1 tablet by mouth in the evening.   No facility-administered encounter medications on file as of 11/25/2020.   Spent 45 minutes on this patient encounter, including preparation, chart review, face-to-face counseling with patient and coordination of care, and documentation of encounter  Follow-up: Return in about 1 month (around 12/25/2020) for Diabetes management.   Bellany Elbaum J De [place], MD

## 2020-11-26 NOTE — Addendum Note (Signed)
Addended by: Virginia Crews D on: 11/26/2020 09:48 AM   Modules accepted: Orders

## 2020-12-07 NOTE — Telephone Encounter (Signed)
Error

## 2020-12-28 ENCOUNTER — Ambulatory Visit: Payer: 59 | Admitting: Physical Therapy

## 2020-12-29 ENCOUNTER — Ambulatory Visit (INDEPENDENT_AMBULATORY_CARE_PROVIDER_SITE_OTHER): Payer: 59 | Admitting: Family Medicine

## 2020-12-29 ENCOUNTER — Other Ambulatory Visit: Payer: Self-pay

## 2020-12-29 VITALS — BP 125/76 | HR 94 | Ht 62.0 in | Wt 154.0 lb

## 2020-12-29 DIAGNOSIS — Z23 Encounter for immunization: Secondary | ICD-10-CM | POA: Diagnosis not present

## 2020-12-29 DIAGNOSIS — E119 Type 2 diabetes mellitus without complications: Secondary | ICD-10-CM | POA: Diagnosis not present

## 2020-12-29 LAB — POCT UA - MICROALBUMIN
Albumin/Creatinine Ratio, Urine, POC: <30
Creatinine, POC: 200 mg/dL
Microalbumin Ur, POC: 80 mg/L

## 2020-12-29 MED ORDER — METFORMIN HCL 500 MG PO TABS: ORAL_TABLET | ORAL | 1 refills | Status: DC

## 2020-12-29 NOTE — Progress Notes (Signed)
    Procedures performed today:    None.  Independent interpretation of notes and tests performed by another provider:   None.  Brief History, Exam, Impression, and Recommendations:    Patient accompanied today by medical interpreter  BP 125/76   Pulse 94   Ht 5\' 2"  (1.575 m)   Wt 154 lb (69.9 kg)   SpO2 98%   BMI 28.17 kg/m   Diabetes (HCC) Has been doing well since last visit Has been taking metformin and gradually increased to 2 tablets in the morning and 1 tablet in the evening.  No significant GI symptoms with this.  Reports that he has been out of the medication for about 1 to 2 weeks.  It appears refill was available, however patient did not pick up his refill from the pharmacy Today, foot exam was completed, urine microalbumin creatinine ratio was checked and lipid panel was drawn Flu shot administered today At next visit, discuss pneumonia vaccine, plan to check A1c Provided refill of metformin for 90-day supply with 1 refill in order to allow for enough medication when he travels to [place]; plan to keep at 1000 mg in morning and 500 mg in the evening - likely titrate further at next visit if tolerating well Plan for follow-up in 3 months once patient returns from [place]  Spent 30 minutes on this patient encounter, including preparation, chart review, face-to-face counseling with patient and coordination of care, and documentation of encounter   ___________________________________________ Fue Cervenka de [place], MD, ABFM, [facility] Primary Care and [place]

## 2020-12-29 NOTE — Assessment & Plan Note (Addendum)
Has been doing well since last visit Has been taking metformin and gradually increased to 2 tablets in the morning and 1 tablet in the evening.  No significant GI symptoms with this.  Reports that he has been out of the medication for about 1 to 2 weeks.  It appears refill was available, however patient did not pick up his refill from the pharmacy Today, foot exam was completed, urine microalbumin creatinine ratio was checked and lipid panel was drawn Flu shot administered today At next visit, discuss pneumonia vaccine, plan to check A1c Provided refill of metformin for 90-day supply with 1 refill in order to allow for enough medication when he travels to [place]; plan to keep at 1000 mg in morning and 500 mg in the evening - likely titrate further at next visit if tolerating well Plan for follow-up in 3 months once patient returns from [place]

## 2020-12-30 LAB — LIPID PANEL
Chol/HDL Ratio: 3.7 ratio (ref 0.0–5.0)
Cholesterol, Total: 164 mg/dL (ref 100–199)
HDL: 44 mg/dL (ref >39)
LDL Chol Calc (NIH): 94 mg/dL (ref 0–99)
Triglycerides: 146 mg/dL (ref 0–149)
VLDL Cholesterol Cal: 26 mg/dL (ref 5–40)

## 2021-01-05 ENCOUNTER — Telehealth (HOSPITAL_BASED_OUTPATIENT_CLINIC_OR_DEPARTMENT_OTHER): Payer: Self-pay

## 2021-01-05 NOTE — Telephone Encounter (Signed)
Per DPR spoke with patients step daughter She is aware and agreeable to lab results and recommendations

## 2021-01-05 NOTE — Telephone Encounter (Signed)
-----   Message from Raymond J de [place], MD sent at 12/30/2020  5:03 PM EDT ----- Cholesterol panel overall looks good.  Given presence of diabetes, will need to discuss use of statin medication at next visit to reduce risk of heart attack and stroke. Urine microalbumin/creatinine ratio are within normal range which is good.

## 2021-03-31 ENCOUNTER — Ambulatory Visit (INDEPENDENT_AMBULATORY_CARE_PROVIDER_SITE_OTHER): Payer: 59 | Admitting: Family Medicine

## 2021-03-31 ENCOUNTER — Other Ambulatory Visit: Payer: Self-pay

## 2021-03-31 ENCOUNTER — Ambulatory Visit (HOSPITAL_BASED_OUTPATIENT_CLINIC_OR_DEPARTMENT_OTHER): Payer: 59 | Admitting: Family Medicine

## 2021-03-31 ENCOUNTER — Encounter (HOSPITAL_BASED_OUTPATIENT_CLINIC_OR_DEPARTMENT_OTHER): Payer: Self-pay | Admitting: Family Medicine

## 2021-03-31 VITALS — BP 104/70 | HR 103 | Ht 62.0 in | Wt 150.0 lb

## 2021-03-31 DIAGNOSIS — M542 Cervicalgia: Secondary | ICD-10-CM | POA: Diagnosis not present

## 2021-03-31 DIAGNOSIS — E119 Type 2 diabetes mellitus without complications: Secondary | ICD-10-CM

## 2021-03-31 DIAGNOSIS — G4700 Insomnia, unspecified: Secondary | ICD-10-CM | POA: Diagnosis not present

## 2021-03-31 LAB — HEMOGLOBIN A1C
Est. average glucose Bld gHb Est-mCnc: 137 mg/dL
Hgb A1c MFr Bld: 6.4 % — ABNORMAL HIGH (ref 4.8–5.6)

## 2021-03-31 MED ORDER — CYCLOBENZAPRINE HCL 5 MG PO TABS: 5.0000 mg | ORAL_TABLET | Freq: Every day | ORAL | 0 refills | Status: DC

## 2021-03-31 MED ORDER — METFORMIN HCL 1000 MG PO TABS: 1000.0000 mg | ORAL_TABLET | Freq: Two times a day (BID) | ORAL | 0 refills | Status: DC

## 2021-03-31 NOTE — Assessment & Plan Note (Signed)
Has been tolerating metformin, currently taking 1000 mg in the morning and 500 mg in the evening Due for recheck of hemoglobin A1c today Given that he has been tolerating metformin, will titrate dose to 1000 mg twice daily, new prescription sent to pharmacy At next visit, discuss proceeding with pneumococcal vaccination

## 2021-03-31 NOTE — Patient Instructions (Signed)
°  Medication Instructions:  Your physician has recommended you make the following change in your medication:  -- INCREASE Metformin 1000 mg by mouth twice daily -- START Melatonin 3 mg - Take nightly 30 min before bedtime --If you need a refill on any your medications before your next appointment, please call your pharmacy first. If no refills are authorized on file call the office.--  [facility] to schedule follows appointment  Follow-Up: Your next appointment:   Your physician recommends that you schedule a follow-up appointment in: 4-6 WEEKS with Dr. de [place]  You will receive a text message or e-mail with a link to a survey about your care and experience with [place] today! We would greatly appreciate your feedback!   Thanks for letting us be apart of your health journey!!  Primary Care and Sports Medicine   Dr. Arlina Robes [place]   We encourage you to activate your patient portal called "MyChart".  Sign up information is provided on this After Visit Summary.  MyChart is used to connect with patients for Virtual Visits (Telemedicine).  Patients are able to view lab/test results, encounter notes, upcoming appointments, etc.  Non-urgent messages can be sent to your provider as well. To learn more about what you can do with MyChart, please visit --  [email or web address].

## 2021-03-31 NOTE — Assessment & Plan Note (Addendum)
Returned from [place] about 10 days ago Has had trouble with falling asleep and staying asleep Has not tried any specific interventions Not currently napping With prior travel between here and [place], previously has not taken any medications, did have some jet lag with prior travel but more of a problem presently Discussed options with patient, will proceed with initiation of OTC melatonin 3 mg once nightly Discussed general sleep hygiene measures as well, avoidance of screen time within 2 hours of bedtime Plan follow-up in 4 to 6 weeks to monitor progress

## 2021-03-31 NOTE — Progress Notes (Signed)
° ° °  Procedures performed today:    None.  Independent interpretation of notes and tests performed by another provider:   None.  Brief History, Exam, Impression, and Recommendations:    BP 104/70    Pulse (!) 103    Ht 5\' 2"  (1.575 m)    Wt 150 lb (68 kg)    SpO2 95%    BMI 27.44 kg/m   Insomnia Returned from [place] about 10 days ago Has had trouble with falling asleep and staying asleep Has not tried any specific interventions Not currently napping With prior travel between here and [place], previously has not taken any medications, did have some jet lag with prior travel but more of a problem presently Discussed options with patient, will proceed with initiation of OTC melatonin 3 mg once nightly Discussed general sleep hygiene measures as well, avoidance of screen time within 2 hours of bedtime Plan follow-up in 4 to 6 weeks to monitor progress  Diabetes ([place]) Has been tolerating metformin, currently taking 1000 mg in the morning and 500 mg in the evening Due for recheck of hemoglobin A1c today Given that he has been tolerating metformin, will titrate dose to 1000 mg twice daily, new prescription sent to pharmacy At next visit, discuss proceeding with pneumococcal vaccination  Cervical spine pain Right-sided cervical spine pain, has been noticeable for the past 3 months Of note, patient has had prior evaluation for cervical spine pain, bilateral upper extremity symptoms with findings on MRI of degenerative changes, severe foraminal stenosis Patient has established with neurosurgeons office, is primarily being managed by pain management at present Discussed options with patient, will proceed with initial treatment of Flexeril nightly as needed Recommend physical therapy, however patient indicates that he has inconsistent transportation and declines at this time Recommend continued follow-up with neurosurgeon/pain management, patient needs to arrange appointment with them Will  request records from neurosurgeons office  Plan for follow-up in about 4 to 6 weeks or sooner as needed   ___________________________________________ Jamarea Selner de [place], MD, ABFM, [facility] Primary Care and [place]

## 2021-03-31 NOTE — Assessment & Plan Note (Addendum)
Right-sided cervical spine pain, has been noticeable for the past 3 months Of note, patient has had prior evaluation for cervical spine pain, bilateral upper extremity symptoms with findings on MRI of degenerative changes, severe foraminal stenosis Patient has established with neurosurgeons office, is primarily being managed by pain management at present Discussed options with patient, will proceed with initial treatment of Flexeril nightly as needed Recommend physical therapy, however patient indicates that he has inconsistent transportation and declines at this time Recommend continued follow-up with neurosurgeon/pain management, patient needs to arrange appointment with them Will request records from neurosurgeons office

## 2021-05-11 ENCOUNTER — Encounter (HOSPITAL_BASED_OUTPATIENT_CLINIC_OR_DEPARTMENT_OTHER): Payer: Self-pay | Admitting: Family Medicine

## 2021-05-11 ENCOUNTER — Other Ambulatory Visit: Payer: Self-pay

## 2021-05-11 ENCOUNTER — Ambulatory Visit (INDEPENDENT_AMBULATORY_CARE_PROVIDER_SITE_OTHER): Payer: 59 | Admitting: Family Medicine

## 2021-05-11 VITALS — BP 110/66 | HR 96 | Ht 62.0 in | Wt 149.2 lb

## 2021-05-11 DIAGNOSIS — E785 Hyperlipidemia, unspecified: Secondary | ICD-10-CM

## 2021-05-11 DIAGNOSIS — M4802 Spinal stenosis, cervical region: Secondary | ICD-10-CM

## 2021-05-11 DIAGNOSIS — E119 Type 2 diabetes mellitus without complications: Secondary | ICD-10-CM

## 2021-05-11 MED ORDER — METFORMIN HCL 1000 MG PO TABS: 1000.0000 mg | ORAL_TABLET | Freq: Two times a day (BID) | ORAL | 1 refills | Status: DC

## 2021-05-11 MED ORDER — ATORVASTATIN CALCIUM 20 MG PO TABS: 20.0000 mg | ORAL_TABLET | Freq: Every day | ORAL | 1 refills | Status: DC

## 2021-05-11 NOTE — Patient Instructions (Signed)
°  Medication Instructions:  Your physician has recommended you make the following change in your medication:  -- START Atorvastatin - take 1 tablet (20 mg) by mouth daily --If you need a refill on any your medications before your next appointment, please call your pharmacy first. If no refills are authorized on file call the office.--  Referrals/Procedures/Imaging: CONTACT [facility]ARD[facility] N[facility]K AND SHOULDER PAIN.  [phone] [address] [place] [address]  Follow-Up: Your next appointment:   Your physician recommends that you schedule a follow-up appointment in: 2 MONTHS with Dr. de [place]  You will receive a text message or e-mail with a link to a survey about your care and experience with [place] today! We would greatly appreciate your feedback!   Thanks for letting us be apart of your health journey!!  Primary Care and Sports Medicine   Dr. Arlina Robes [place]   We encourage you to activate your patient portal called "MyChart".  Sign up information is provided on this After Visit Summary.  MyChart is used to connect with patients for Virtual Visits (Telemedicine).  Patients are able to view lab/test results, encounter notes, upcoming appointments, etc.  Non-urgent messages can be sent to your provider as well. To learn more about what you can do with MyChart, please visit --  [email or web address].

## 2021-05-11 NOTE — Assessment & Plan Note (Signed)
Patient with diagnosis of diabetes, ASCVD risk score of 17% As above, discussed role of statin therapy, patient interested in this, started on atorvastatin 20 mg daily

## 2021-05-11 NOTE — Assessment & Plan Note (Signed)
Reviewed recent hemoglobin A1c which is at goal at 6.4% Has been tolerating metformin 1000 g twice daily fairly well, is amenable to continuing with current dosing Discussed need for pneumococcal vaccination given diabetes diagnosis and patient age, he is open to this, administered today Recommend initiating statin given ASCVD risk score and diagnosis of diabetes, discussed risks and benefits related to statin initiation, patient would like to proceed with this, will start atorvastatin 20 mg daily and monitor progress with this, likely recheck lipid panel around time of next visit

## 2021-05-11 NOTE — Progress Notes (Signed)
° ° °  Procedures performed today:    None.  Independent interpretation of notes and tests performed by another provider:   None.  Brief History, Exam, Impression, and Recommendations:    BP 110/66    Pulse 96    Ht 5\' 2"  (1.575 m)    Wt 149 lb 3.2 oz (67.7 kg)    SpO2 99%    BMI 27.29 kg/m   Foraminal stenosis of cervical region Previously referred to neurosurgery by another provider, did not establish however Indicates that he is still having some neck/right shoulder and arm pain Again provided information to patient regarding prior referral to neurosurgery, provided with information for neurosurgeons office for patient to contact to schedule establishing visit  Diabetes ([place]) Reviewed recent hemoglobin A1c which is at goal at 6.4% Has been tolerating metformin 1000 g twice daily fairly well, is amenable to continuing with current dosing Discussed need for pneumococcal vaccination given diabetes diagnosis and patient age, he is open to this, administered today Recommend initiating statin given ASCVD risk score and diagnosis of diabetes, discussed risks and benefits related to statin initiation, patient would like to proceed with this, will start atorvastatin 20 mg daily and monitor progress with this, likely recheck lipid panel around time of next visit  Dyslipidemia Patient with diagnosis of diabetes, ASCVD risk score of 17% As above, discussed role of statin therapy, patient interested in this, started on atorvastatin 20 mg daily  Plan for follow-up in about 2 months or sooner as needed   ___________________________________________ Terese Heier de [place], MD, ABFM, [facility] Primary Care and [place]

## 2021-05-11 NOTE — Assessment & Plan Note (Addendum)
Previously referred to neurosurgery by another provider, did not establish however Indicates that he is still having some neck/right shoulder and arm pain Again provided information to patient regarding prior referral to neurosurgery, provided with information for neurosurgeons office for patient to contact to schedule establishing visit

## 2021-06-28 ENCOUNTER — Other Ambulatory Visit (HOSPITAL_BASED_OUTPATIENT_CLINIC_OR_DEPARTMENT_OTHER): Payer: Self-pay | Admitting: Family Medicine

## 2021-06-28 DIAGNOSIS — E119 Type 2 diabetes mellitus without complications: Secondary | ICD-10-CM

## 2021-07-11 ENCOUNTER — Ambulatory Visit (HOSPITAL_BASED_OUTPATIENT_CLINIC_OR_DEPARTMENT_OTHER): Payer: 59 | Admitting: Family Medicine

## 2021-07-28 ENCOUNTER — Encounter (HOSPITAL_BASED_OUTPATIENT_CLINIC_OR_DEPARTMENT_OTHER): Payer: Self-pay | Admitting: Family Medicine

## 2021-07-28 ENCOUNTER — Ambulatory Visit (INDEPENDENT_AMBULATORY_CARE_PROVIDER_SITE_OTHER): Payer: 59 | Admitting: Family Medicine

## 2021-07-28 VITALS — BP 106/71 | HR 92 | Ht 62.0 in | Wt 145.8 lb

## 2021-07-28 DIAGNOSIS — E785 Hyperlipidemia, unspecified: Secondary | ICD-10-CM

## 2021-07-28 DIAGNOSIS — M4802 Spinal stenosis, cervical region: Secondary | ICD-10-CM

## 2021-07-28 DIAGNOSIS — Z0000 Encounter for general adult medical examination without abnormal findings: Secondary | ICD-10-CM

## 2021-07-28 DIAGNOSIS — E119 Type 2 diabetes mellitus without complications: Secondary | ICD-10-CM

## 2021-07-28 MED ORDER — METFORMIN HCL 1000 MG PO TABS: 1000.0000 mg | ORAL_TABLET | Freq: Two times a day (BID) | ORAL | 1 refills | Status: DC

## 2021-07-28 MED ORDER — CYCLOBENZAPRINE HCL 5 MG PO TABS: 5.0000 mg | ORAL_TABLET | Freq: Every day | ORAL | 0 refills | Status: DC

## 2021-07-28 MED ORDER — ATORVASTATIN CALCIUM 20 MG PO TABS: 20.0000 mg | ORAL_TABLET | Freq: Every day | ORAL | 1 refills | Status: DC

## 2021-07-28 NOTE — Progress Notes (Signed)
? ? ?  Procedures performed today:   ? ?None. ? ?Independent interpretation of notes and tests performed by another provider:  ? ?None. ? ?Brief History, Exam, Impression, and Recommendations:   ? ?BP 106/71   Pulse 92   Ht '5\' 2"'$  (1.575 m)   Wt 145 lb 12.8 oz (66.1 kg)   SpO2 95%   BMI 26.67 kg/m?  ? ?Patient reports that he has not been taking any medications for about 2 months now. Reports that pharmacy did not have any prescriptions for medications. It does appear that prescriptions have been sent recently enough that there should be remaining medications for patient at the pharmacy ? ?Diabetes ([place]) ?Patient continues with metformin, although reports some issues with prescription recently ?Would like to have A1c checked today ?Continues with statin for risk reduction ?Plan to complete foot exam at next visit ?UTD on nephropathy screening ?Referral to ophtho provided today for retinopathy screening ? ?Foraminal stenosis of cervical region ?Reports that he has scheduled an appt with neurosurgeon for later this month ?Would like refill on cyclobenzaprine today for help with managing intermittent pain/spasm. Refill provided ? ?Dyslipidemia ?Has been taking atorvastatin, tolerating well ?Provided refill today ?Plan to recheck lipid panel with upcoming CPE ? ?Plan for follow-up in 3 months for CPE, nurse visit for labs a few days prior ? ? ?___________________________________________ ?Tiernan Suto de [place], MD, ABFM, CA[facility] ?Primary Care and Sports Medicine ?[place] ?

## 2021-07-28 NOTE — Assessment & Plan Note (Signed)
Has been taking atorvastatin, tolerating well ?Provided refill today ?Plan to recheck lipid panel with upcoming CPE ?

## 2021-07-28 NOTE — Assessment & Plan Note (Signed)
Patient continues with metformin, although reports some issues with prescription recently ?Would like to have A1c checked today ?Continues with statin for risk reduction ?Plan to complete foot exam at next visit ?UTD on nephropathy screening ?Referral to ophtho provided today for retinopathy screening ?

## 2021-07-28 NOTE — Assessment & Plan Note (Signed)
Reports that he has scheduled an appt with neurosurgeon for later this month ?Would like refill on cyclobenzaprine today for help with managing intermittent pain/spasm. Refill provided ?

## 2021-07-29 LAB — HEMOGLOBIN A1C
Est. average glucose Bld gHb Est-mCnc: 140 mg/dL
Hgb A1c MFr Bld: 6.5 % — ABNORMAL HIGH (ref 4.8–5.6)

## 2021-08-01 NOTE — Progress Notes (Signed)
Pt is aware of lab result. ? ?Thanks

## 2021-08-03 ENCOUNTER — Telehealth (HOSPITAL_BASED_OUTPATIENT_CLINIC_OR_DEPARTMENT_OTHER): Payer: Self-pay | Admitting: Family Medicine

## 2021-08-03 NOTE — Telephone Encounter (Signed)
Encounter made in error. 

## 2021-09-28 ENCOUNTER — Other Ambulatory Visit (HOSPITAL_BASED_OUTPATIENT_CLINIC_OR_DEPARTMENT_OTHER): Payer: Self-pay

## 2021-09-28 DIAGNOSIS — M542 Cervicalgia: Secondary | ICD-10-CM

## 2021-10-08 ENCOUNTER — Other Ambulatory Visit (HOSPITAL_BASED_OUTPATIENT_CLINIC_OR_DEPARTMENT_OTHER): Payer: Self-pay | Admitting: Family Medicine

## 2021-10-25 ENCOUNTER — Other Ambulatory Visit (HOSPITAL_BASED_OUTPATIENT_CLINIC_OR_DEPARTMENT_OTHER): Payer: Self-pay | Admitting: Family Medicine

## 2021-10-25 ENCOUNTER — Ambulatory Visit (HOSPITAL_BASED_OUTPATIENT_CLINIC_OR_DEPARTMENT_OTHER): Payer: Managed Care, Other (non HMO)

## 2021-10-26 LAB — CBC WITH DIFFERENTIAL/PLATELET
Basophils Absolute: 0.1 10*3/uL (ref 0.0–0.2)
Basos: 1 %
EOS (ABSOLUTE): 0.5 10*3/uL — ABNORMAL HIGH (ref 0.0–0.4)
Eos: 8 %
Hematocrit: 47.1 % (ref 37.5–51.0)
Hemoglobin: 15.4 g/dL (ref 13.0–17.7)
Immature Grans (Abs): 0 10*3/uL (ref 0.0–0.1)
Immature Granulocytes: 1 %
Lymphocytes Absolute: 1.9 10*3/uL (ref 0.7–3.1)
Lymphs: 28 %
MCH: 30.8 pg (ref 26.6–33.0)
MCHC: 32.7 g/dL (ref 31.5–35.7)
MCV: 94 fL (ref 79–97)
Monocytes Absolute: 0.7 10*3/uL (ref 0.1–0.9)
Monocytes: 10 %
Neutrophils Absolute: 3.5 10*3/uL (ref 1.4–7.0)
Neutrophils: 52 %
Platelets: 277 10*3/uL (ref 150–450)
RBC: 5 x10E6/uL (ref 4.14–5.80)
RDW: 12 % (ref 11.6–15.4)
WBC: 6.7 10*3/uL (ref 3.4–10.8)

## 2021-10-26 LAB — COMPREHENSIVE METABOLIC PANEL
ALT: 27 IU/L (ref 0–44)
AST: 23 IU/L (ref 0–40)
Albumin/Globulin Ratio: 1.8 (ref 1.2–2.2)
Albumin: 4.4 g/dL (ref 3.9–4.9)
Alkaline Phosphatase: 60 IU/L (ref 44–121)
BUN/Creatinine Ratio: 16 (ref 10–24)
BUN: 13 mg/dL (ref 8–27)
Bilirubin Total: 0.2 mg/dL (ref 0.0–1.2)
CO2: 23 mmol/L (ref 20–29)
Calcium: 9.7 mg/dL (ref 8.6–10.2)
Chloride: 103 mmol/L (ref 96–106)
Creatinine, Ser: 0.82 mg/dL (ref 0.76–1.27)
Globulin, Total: 2.5 g/dL (ref 1.5–4.5)
Glucose: 111 mg/dL — ABNORMAL HIGH (ref 70–99)
Potassium: 4.6 mmol/L (ref 3.5–5.2)
Sodium: 139 mmol/L (ref 134–144)
Total Protein: 6.9 g/dL (ref 6.0–8.5)
eGFR: 97 mL/min/{1.73_m2} (ref >59)

## 2021-10-26 LAB — TSH: TSH: 1.16 u[IU]/mL (ref 0.450–4.500)

## 2021-10-26 LAB — HEMOGLOBIN A1C
Est. average glucose Bld gHb Est-mCnc: 140 mg/dL
Hgb A1c MFr Bld: 6.5 % — ABNORMAL HIGH (ref 4.8–5.6)

## 2021-10-26 LAB — LIPID PANEL
Chol/HDL Ratio: 2.8 ratio (ref 0.0–5.0)
Cholesterol, Total: 162 mg/dL (ref 100–199)
HDL: 57 mg/dL (ref >39)
LDL Chol Calc (NIH): 88 mg/dL (ref 0–99)
Triglycerides: 94 mg/dL (ref 0–149)
VLDL Cholesterol Cal: 17 mg/dL (ref 5–40)

## 2021-10-31 ENCOUNTER — Encounter (HOSPITAL_BASED_OUTPATIENT_CLINIC_OR_DEPARTMENT_OTHER): Payer: Self-pay | Admitting: Family Medicine

## 2021-10-31 ENCOUNTER — Ambulatory Visit (INDEPENDENT_AMBULATORY_CARE_PROVIDER_SITE_OTHER): Payer: Commercial Managed Care - HMO | Admitting: Family Medicine

## 2021-10-31 VITALS — BP 111/73 | HR 90 | Temp 97.9°F | Ht 62.0 in | Wt 148.2 lb

## 2021-10-31 DIAGNOSIS — E119 Type 2 diabetes mellitus without complications: Secondary | ICD-10-CM

## 2021-10-31 DIAGNOSIS — Z0000 Encounter for general adult medical examination without abnormal findings: Secondary | ICD-10-CM | POA: Diagnosis not present

## 2021-10-31 DIAGNOSIS — Z23 Encounter for immunization: Secondary | ICD-10-CM

## 2021-10-31 DIAGNOSIS — E785 Hyperlipidemia, unspecified: Secondary | ICD-10-CM

## 2021-10-31 MED ORDER — SHINGRIX 50 MCG/0.5ML IM SUSR: 0.5000 mL | Freq: Once | INTRAMUSCULAR | 0 refills | Status: AC

## 2021-10-31 MED ORDER — CYCLOBENZAPRINE HCL 5 MG PO TABS: ORAL_TABLET | ORAL | 0 refills | Status: DC

## 2021-10-31 MED ORDER — ATORVASTATIN CALCIUM 20 MG PO TABS: 20.0000 mg | ORAL_TABLET | Freq: Every day | ORAL | 1 refills | Status: DC

## 2021-10-31 MED ORDER — METFORMIN HCL 1000 MG PO TABS: 1000.0000 mg | ORAL_TABLET | Freq: Two times a day (BID) | ORAL | 1 refills | Status: DC

## 2021-10-31 NOTE — Assessment & Plan Note (Addendum)
Routine HCM labs reviewed. HCM reviewed/discussed. Anticipatory guidance regarding healthy weight, lifestyle and choices given. Recommend healthy diet.  Recommend approximately 150 minutes/week of moderate intensity exercise Recommend regular dental and vision exams Always use seatbelt/lap and shoulder restraints Recommend using smoke alarms and checking batteries at least twice a year Recommend using sunscreen when outside Discussed colon cancer screening recommendations, options.  Patient had this completed in [place], has report with him, updated in [facility] Discussed recommendations for shingles vaccine.  Patient amenable, Rx sent Discussed tetanus immunization recommendations, patient reports being UTD

## 2021-10-31 NOTE — Progress Notes (Signed)
Subjective:    CC: Annual Physical Exam  HPI:  David Morales is a 66 y.o. presenting for annual physical  I reviewed the past medical history, family history, social history, surgical history, and allergies today and no changes were needed.  Please see the problem list section below in epic for further details.  Past Medical History: Past Medical History:  Diagnosis Date   Colon polyps    Diabetes mellitus without complication ([place])    Hyperlipidemia    Hypertension    Past Surgical History: Past Surgical History:  Procedure Laterality Date   COLON SURGERY     COLONOSCOPY     Social History: Social History   Socioeconomic History   Marital status: Married    Spouse name: Not on file   Number of children: Not on file   Years of education: Not on file   Highest education level: Not on file  Occupational History   Occupation: painter  Tobacco Use   Smoking status: Former    Packs/day: 0.25    Years: 11.00    Total pack years: 2.75    Types: Cigarettes    Quit date: 10/25/2020    Years since quitting: 1.0   Smokeless tobacco: Never  Vaping Use   Vaping Use: Never used  Substance and Sexual Activity   Alcohol use: Yes    Comment: occasional   Drug use: No   Sexual activity: Not on file  Other Topics Concern   Not on file  Social History Narrative   Not on file   Social Determinants of Health   Financial Resource Strain: Not on file  Food Insecurity: Not on file  Transportation Needs: Not on file  Physical Activity: Not on file  Stress: Not on file  Social Connections: Not on file   Family History: History reviewed. No pertinent family history. Allergies: No Known Allergies Medications: See med rec.  Review of Systems: No headache, visual changes, nausea, vomiting, diarrhea, constipation, dizziness, abdominal pain, skin rash, fevers, chills, night sweats, swollen lymph nodes, weight loss, chest pain, body aches, joint swelling, muscle aches, shortness of  breath, mood changes, visual or auditory hallucinations.  Objective:    BP 111/73   Pulse 90   Temp 97.9 F (36.6 C) (Oral)   Ht '5\' 2"'$  (1.575 m)   Wt 148 lb 3.2 oz (67.2 kg)   SpO2 96%   BMI 27.11 kg/m   General: Well Developed, well nourished, and in no acute distress.  Neuro: Alert and oriented x3, extra-ocular muscles intact, sensation grossly intact. Cranial nerves II through XII are intact, motor, sensory, and coordinative functions are all intact. HEENT: Normocephalic, atraumatic, pupils equal round reactive to light, neck supple, no masses, no lymphadenopathy, thyroid nonpalpable. Oropharynx, nasopharynx, external ear canals are unremarkable. Skin: Warm and dry, no rashes noted.  Cardiac: Regular rate and rhythm, no murmurs rubs or gallops. Respiratory: Clear to auscultation bilaterally. Not using accessory muscles, speaking in full sentences. Abdominal: Soft, nontender, nondistended, positive bowel sounds, no masses, no organomegaly. Musculoskeletal: Shoulder, elbow, wrist, hip, knee, ankle stable, and with full range of motion.  Impression and Recommendations:    Wellness examination Routine HCM labs reviewed. HCM reviewed/discussed. Anticipatory guidance regarding healthy weight, lifestyle and choices given. Recommend healthy diet.  Recommend approximately 150 minutes/week of moderate intensity exercise Recommend regular dental and vision exams Always use seatbelt/lap and shoulder restraints Recommend using smoke alarms and checking batteries at least twice a year Recommend using sunscreen when outside Discussed colon  cancer screening recommendations, options.  Patient had this completed in [place], has report with him, updated in [place] Discussed recommendations for shingles vaccine.  Patient amenable, Rx sent Discussed tetanus immunization recommendations, patient reports being UTD  Return in about 3 months (around 01/31/2022) for  DM.   ___________________________________________ Yasha Tibbett de [place], MD, ABFM, CAQSM Primary Care and [place]

## 2021-10-31 NOTE — Patient Instructions (Signed)
  Medication Instructions:  Your physician recommends that you continue on your current medications as directed. Please refer to the Current Medication list given to you today. --If you need a refill on any your medications before your next appointment, please call your pharmacy first. If no refills are authorized on file call the office.-- Lab Work: Your physician has recommended that you have lab work today: No If you have labs (blood work) drawn today and your tests are completely normal, you will receive your results via [place] a phone call from our staff.  Please ensure you check your voicemail in the event that you authorized detailed messages to be left on a delegated number. If you have any lab test that is abnormal or we need to change your treatment, we will call you to review the results.  Referrals/Procedures/Imaging: No  Follow-Up: Your next appointment:   Your physician recommends that you schedule a follow-up appointment in: 3 month with Dr. de [place].  You will receive a text message or e-mail with a link to a survey about your care and experience with [place] today! We would greatly appreciate your feedback!   Thanks for letting us be apart of your health journey!!  Primary Care and Sports Medicine   Dr. Arlina Robes [place]   We encourage you to activate your patient portal called "MyChart".  Sign up information is provided on this After Visit Summary.  MyChart is used to connect with patients for Virtual Visits (Telemedicine).  Patients are able to view lab/test results, encounter notes, upcoming appointments, etc.  Non-urgent messages can be sent to your provider as well. To learn more about what you can do with MyChart, please visit --  [email or web address].

## 2022-02-03 ENCOUNTER — Ambulatory Visit (INDEPENDENT_AMBULATORY_CARE_PROVIDER_SITE_OTHER): Payer: Commercial Managed Care - HMO | Admitting: Family Medicine

## 2022-02-03 ENCOUNTER — Encounter (HOSPITAL_BASED_OUTPATIENT_CLINIC_OR_DEPARTMENT_OTHER): Payer: Self-pay | Admitting: Family Medicine

## 2022-02-03 VITALS — BP 128/86 | HR 84 | Temp 97.7°F | Ht 62.0 in | Wt 148.3 lb

## 2022-02-03 DIAGNOSIS — M4802 Spinal stenosis, cervical region: Secondary | ICD-10-CM

## 2022-02-03 DIAGNOSIS — E119 Type 2 diabetes mellitus without complications: Secondary | ICD-10-CM

## 2022-02-03 DIAGNOSIS — R030 Elevated blood-pressure reading, without diagnosis of hypertension: Secondary | ICD-10-CM

## 2022-02-03 DIAGNOSIS — E785 Hyperlipidemia, unspecified: Secondary | ICD-10-CM

## 2022-02-03 DIAGNOSIS — Z23 Encounter for immunization: Secondary | ICD-10-CM

## 2022-02-03 MED ORDER — CYCLOBENZAPRINE HCL 5 MG PO TABS: ORAL_TABLET | ORAL | 0 refills | Status: DC

## 2022-02-03 MED ORDER — ATORVASTATIN CALCIUM 20 MG PO TABS: 20.0000 mg | ORAL_TABLET | Freq: Every day | ORAL | 1 refills | Status: DC

## 2022-02-03 MED ORDER — METFORMIN HCL 1000 MG PO TABS: 1000.0000 mg | ORAL_TABLET | Freq: Two times a day (BID) | ORAL | 1 refills | Status: DC

## 2022-02-03 NOTE — Assessment & Plan Note (Signed)
Recent hemoglobin A1c readings have been at goal at 6.5%.  He continues with metformin, denies any issues today.  He is requesting refill of medication, primarily because he will be traveling to [place] in the coming months. Patient is up-to-date with foot exam.  We will proceed with checking hemoglobin A1c today for monitoring, we will also update urine microalbumin/creatinine ratio which was last completed about 1 year ago. Refill for metformin was sent to pharmacy on file

## 2022-02-03 NOTE — Assessment & Plan Note (Signed)
Requesting refill of cyclobenzaprine today to utilize as needed to help with associated neck pain.  He indicates that he did establish with neurosurgeon, had appointments earlier this year and has follow-up scheduled for next month. At this time, we can allow for refill of cyclobenzaprine, refill sent to pharmacy.  Encourage patient to follow-up closely with neurosurgeon for continued management

## 2022-02-03 NOTE — Patient Instructions (Signed)
  Medication Instructions:  Your physician recommends that you continue on your current medications as directed. Please refer to the Current Medication list given to you today. --If you need a refill on any your medications before your next appointment, please call your pharmacy first. If no refills are authorized on file call the office.-- Lab Work: Your physician has recommended that you have lab work today: Yes If you have labs (blood work) drawn today and your tests are completely normal, you will receive your results via MyChart message OR a phone call from our staff.  Please ensure you check your voicemail in the event that you authorized detailed messages to be left on a delegated number. If you have any lab test that is abnormal or we need to change your treatment, we will call you to review the results.  Referrals/Procedures/Imaging: No  Follow-Up: Your next appointment:   Your physician recommends that you schedule a follow-up appointment in: 4 months with Dr. de [place].  You will receive a text message or e-mail with a link to a survey about your care and experience with us today! We would greatly appreciate your feedback!   Thanks for letting us be apart of your health journey!!  Primary Care and Sports Medicine   Dr. Raymond de [place]   We encourage you to activate your patient portal called "MyChart".  Sign up information is provided on this After Visit Summary.  MyChart is used to connect with patients for Virtual Visits (Telemedicine).  Patients are able to view lab/test results, encounter notes, upcoming appointments, etc.  Non-urgent messages can be sent to your provider as well. To learn more about what you can do with MyChart, please visit --  [email or web address].    

## 2022-02-03 NOTE — Assessment & Plan Note (Signed)
Patient continues with atorvastatin, has been tolerating well, denies any issues with myalgias.  Is requesting refill of medications today, refill provided

## 2022-02-03 NOTE — Assessment & Plan Note (Addendum)
Blood pressure has been variable at prior office visits, is slightly elevated today.  At last office visit it was better controlled.  He has not been having any issues with chest pain or headaches.  Recheck of blood pressure today is better controlled We will proceed with intermittent monitoring of blood pressure at home, DASH diet, provided handout reviewing DASH diet.

## 2022-02-03 NOTE — Progress Notes (Signed)
    Procedures performed today:    None.  Independent interpretation of notes and tests performed by another provider:   None.  Brief History, Exam, Impression, and Recommendations:    BP 128/86   Pulse 84   Temp 97.7 F (36.5 C) (Oral)   Ht '5\' 2"'$  (1.575 m)   Wt 148 lb 4.8 oz (67.3 kg)   SpO2 100%   BMI 27.12 kg/m   Diabetes (HCC) Recent hemoglobin A1c readings have been at goal at 6.5%.  He continues with metformin, denies any issues today.  He is requesting refill of medication, primarily because he will be traveling to [place] in the coming months. Patient is up-to-date with foot exam.  We will proceed with checking hemoglobin A1c today for monitoring, we will also update urine microalbumin/creatinine ratio which was last completed about 1 year ago. Refill for metformin was sent to pharmacy on file  Elevated blood pressure reading in office without diagnosis of hypertension Blood pressure has been variable at prior office visits, is slightly elevated today.  At last office visit it was better controlled.  He has not been having any issues with chest pain or headaches.  Recheck of blood pressure today is better controlled We will proceed with intermittent monitoring of blood pressure at home, DASH diet, provided handout reviewing DASH diet.  Dyslipidemia Patient continues with atorvastatin, has been tolerating well, denies any issues with myalgias.  Is requesting refill of medications today, refill provided  Foraminal stenosis of cervical region Requesting refill of cyclobenzaprine today to utilize as needed to help with associated neck pain.  He indicates that he did establish with neurosurgeon, had appointments earlier this year and has follow-up scheduled for next month. At this time, we can allow for refill of cyclobenzaprine, refill sent to pharmacy.  Encourage patient to follow-up closely with neurosurgeon for continued management  Return in about 4 months (around  06/04/2022) for DM.   ___________________________________________ Timmie Dugue de [place], MD, ABFM, CAQSM Primary Care and [place]

## 2022-02-05 LAB — HEMOGLOBIN A1C
Est. average glucose Bld gHb Est-mCnc: 134 mg/dL
Hgb A1c MFr Bld: 6.3 % — ABNORMAL HIGH (ref 4.8–5.6)

## 2022-02-05 LAB — MICROALBUMIN / CREATININE URINE RATIO
Creatinine, Urine: 44.1 mg/dL
Microalb/Creat Ratio: 108 mg/g creat — ABNORMAL HIGH (ref 0–29)
Microalbumin, Urine: 47.8 ug/mL

## 2022-03-25 ENCOUNTER — Other Ambulatory Visit (HOSPITAL_BASED_OUTPATIENT_CLINIC_OR_DEPARTMENT_OTHER): Payer: Self-pay | Admitting: Family Medicine

## 2022-04-04 ENCOUNTER — Other Ambulatory Visit (HOSPITAL_BASED_OUTPATIENT_CLINIC_OR_DEPARTMENT_OTHER): Payer: Self-pay

## 2022-04-04 DIAGNOSIS — E119 Type 2 diabetes mellitus without complications: Secondary | ICD-10-CM

## 2022-04-04 DIAGNOSIS — E785 Hyperlipidemia, unspecified: Secondary | ICD-10-CM

## 2022-04-04 MED ORDER — METFORMIN HCL 1000 MG PO TABS: 1000.0000 mg | ORAL_TABLET | Freq: Two times a day (BID) | ORAL | 1 refills | Status: DC

## 2022-04-04 MED ORDER — ATORVASTATIN CALCIUM 20 MG PO TABS: 20.0000 mg | ORAL_TABLET | Freq: Every day | ORAL | 1 refills | Status: DC

## 2022-05-02 ENCOUNTER — Other Ambulatory Visit (HOSPITAL_BASED_OUTPATIENT_CLINIC_OR_DEPARTMENT_OTHER): Payer: Self-pay

## 2022-05-02 MED ORDER — CYCLOBENZAPRINE HCL 5 MG PO TABS: ORAL_TABLET | ORAL | 0 refills | Status: DC

## 2022-07-06 ENCOUNTER — Ambulatory Visit (INDEPENDENT_AMBULATORY_CARE_PROVIDER_SITE_OTHER): Payer: BLUE CROSS/BLUE SHIELD | Admitting: Family Medicine

## 2022-07-06 ENCOUNTER — Encounter (HOSPITAL_BASED_OUTPATIENT_CLINIC_OR_DEPARTMENT_OTHER): Payer: Self-pay | Admitting: Family Medicine

## 2022-07-06 DIAGNOSIS — E119 Type 2 diabetes mellitus without complications: Secondary | ICD-10-CM | POA: Diagnosis not present

## 2022-07-06 DIAGNOSIS — E785 Hyperlipidemia, unspecified: Secondary | ICD-10-CM | POA: Diagnosis not present

## 2022-07-06 MED ORDER — ATORVASTATIN CALCIUM 20 MG PO TABS: 20.0000 mg | ORAL_TABLET | Freq: Every day | ORAL | 1 refills | Status: DC

## 2022-07-06 MED ORDER — METFORMIN HCL 1000 MG PO TABS
1000.0000 mg | ORAL_TABLET | Freq: Two times a day (BID) | ORAL | 1 refills | Status: DC
Start: 2022-07-06 — End: 2022-10-11

## 2022-07-06 NOTE — Progress Notes (Signed)
Established Patient Office Visit Diabetes Mellitus Type II, Follow-up   Subjective   Patient ID: David Morales, male    DOB: 1955/06/22  Age: 67 y.o. MRN: [id number]  Chief Complaint  Patient presents with   Follow-up    Pt here for f/u on DM, pt need refills on his medications would like 90 day refills if possible    Patient here for follow-up of Type 2 diabetes mellitus. Interpreter is present in the room.   Current symptoms/problems include none and have been stable.   Cardiovascular risk factors: advanced age (older than 4255 for men, 665 for women), diabetes mellitus, and male gender  Current diabetic medications include oral agent (monotherapy): Glucophage 1,000mg  BID.   Home blood sugar records: fasting range: 90-110 mg/dL    The 40-JWJX10-year ASCVD risk score (Arnett DK, et al., 2019) is: 21.1%   Values used to calculate the score:     Age: [age] years     Sex: Male     Is Non-Hispanic African American: No     Diabetic: Yes     Tobacco smoker: No     Systolic Blood Pressure: 131 mmHg     Is BP treated: No     HDL Cholesterol: 57 mg/dL     Total Cholesterol: 162 mg/dL   Review of Systems  Constitutional:  Negative for malaise/fatigue.  HENT:  Negative for ear pain and tinnitus.   Eyes:  Negative for blurred vision and double vision.  Respiratory:  Negative for cough and shortness of breath.   Cardiovascular:  Negative for chest pain.  Gastrointestinal:  Negative for abdominal pain, nausea and vomiting.  Musculoskeletal:  Negative for myalgias.  Neurological:  Negative for dizziness, weakness and headaches.  Psychiatric/Behavioral:  Negative for depression and suicidal ideas. The patient is not nervous/anxious.       Objective:     BP 131/77 (BP Location: Left Arm, Patient Position: Sitting, Cuff Size: Normal)   Pulse 98   Temp 97.6 F (36.4 C) (Oral)   Ht 5\' 2"  (1.575 m)   Wt 156 lb (70.8 kg)   SpO2 100%   BMI 28.53 kg/m  BP Readings from Last 3 Encounters:   07/06/22 131/77  02/03/22 128/86  10/31/21 111/73     Physical Exam Constitutional:      Appearance: Normal appearance.  Cardiovascular:     Rate and Rhythm: Normal rate and regular rhythm.     Pulses: Normal pulses.     Heart sounds: Normal heart sounds.  Pulmonary:     Effort: Pulmonary effort is normal.     Breath sounds: Normal breath sounds.  Neurological:     Mental Status: He is alert.  Psychiatric:        Mood and Affect: Mood normal.        Behavior: Behavior normal.        Thought Content: Thought content normal.        Judgment: Judgment normal.       Assessment & Plan:  1. Dyslipidemia ASCVD risk reviewed with patient. Continue with current medication regimen. Refilled Lipitor 20mg  daily.  - atorvastatin (LIPITOR) 20 MG tablet; Take 1 tablet (20 mg total) by mouth daily.  Dispense: 90 tablet; Refill: 1  2. Type 2 diabetes mellitus without complication, without long-term current use of insulin Hemoglobin A1c ordered in lab today. He has been taking metformin 1000mg  twice daily with meals as prescribed. Denies chest pain, shortness of breath, vision changes, polyuria, polyphagia, and polydipsia.  He does occasionally check fasting blood sugars at home and are usually around 90-110mg /dLs. Last A1c 6.3 was done on 02/03/2022. Patient is tolerating medication with no side effects. Plan to continue current medication regimen. Refill sent today. Will adjust medication if necessary once labs return. Advised him to f/u in 3 months for diabetes.  - metFORMIN (GLUCOPHAGE) 1000 MG tablet; Take 1 tablet (1,000 mg total) by mouth 2 (two) times daily with a meal.  Dispense: 90 tablet; Refill: 1 - Hemoglobin A1c    Return in about 3 months (around 10/05/2022) for Diabetes f/u.    Alyson Reedy, FNP

## 2022-07-07 ENCOUNTER — Other Ambulatory Visit (HOSPITAL_BASED_OUTPATIENT_CLINIC_OR_DEPARTMENT_OTHER): Payer: Self-pay | Admitting: Family Medicine

## 2022-07-07 DIAGNOSIS — E119 Type 2 diabetes mellitus without complications: Secondary | ICD-10-CM

## 2022-07-07 LAB — HEMOGLOBIN A1C
Est. average glucose Bld gHb Est-mCnc: 140 mg/dL
Hgb A1c MFr Bld: 6.5 % — ABNORMAL HIGH (ref 4.8–5.6)

## 2022-10-11 ENCOUNTER — Other Ambulatory Visit (HOSPITAL_BASED_OUTPATIENT_CLINIC_OR_DEPARTMENT_OTHER): Payer: Self-pay

## 2022-10-11 ENCOUNTER — Ambulatory Visit (HOSPITAL_BASED_OUTPATIENT_CLINIC_OR_DEPARTMENT_OTHER): Payer: BLUE CROSS/BLUE SHIELD | Admitting: Family Medicine

## 2022-10-11 DIAGNOSIS — E785 Hyperlipidemia, unspecified: Secondary | ICD-10-CM

## 2022-10-11 DIAGNOSIS — E119 Type 2 diabetes mellitus without complications: Secondary | ICD-10-CM

## 2022-10-11 MED ORDER — METFORMIN HCL 1000 MG PO TABS
1000.0000 mg | ORAL_TABLET | Freq: Two times a day (BID) | ORAL | 1 refills | Status: AC
Start: 2022-10-11 — End: ?
  Filled 2022-10-11: qty 90, 45d supply, fill #0
  Filled 2022-11-22: qty 90, 45d supply, fill #1

## 2022-10-11 MED ORDER — ATORVASTATIN CALCIUM 20 MG PO TABS
20.0000 mg | ORAL_TABLET | Freq: Every day | ORAL | 1 refills | Status: DC
Start: 2022-10-11 — End: 2023-12-20
  Filled 2022-10-11: qty 90, 90d supply, fill #0
  Filled 2022-11-22: qty 90, 90d supply, fill #1

## 2022-11-23 ENCOUNTER — Other Ambulatory Visit (HOSPITAL_BASED_OUTPATIENT_CLINIC_OR_DEPARTMENT_OTHER): Payer: Self-pay

## 2022-11-23 ENCOUNTER — Other Ambulatory Visit: Payer: Self-pay

## 2022-12-26 ENCOUNTER — Other Ambulatory Visit (HOSPITAL_BASED_OUTPATIENT_CLINIC_OR_DEPARTMENT_OTHER): Payer: Self-pay | Admitting: Family Medicine

## 2022-12-26 ENCOUNTER — Other Ambulatory Visit (HOSPITAL_COMMUNITY): Payer: Self-pay

## 2022-12-26 DIAGNOSIS — E119 Type 2 diabetes mellitus without complications: Secondary | ICD-10-CM

## 2022-12-26 MED ORDER — METFORMIN HCL 1000 MG PO TABS
1000.0000 mg | ORAL_TABLET | Freq: Two times a day (BID) | ORAL | 1 refills | Status: DC
Start: 2022-12-26 — End: 2023-01-01
  Filled 2022-12-26: qty 90, 45d supply, fill #0

## 2022-12-27 ENCOUNTER — Other Ambulatory Visit (HOSPITAL_COMMUNITY): Payer: Self-pay

## 2022-12-29 ENCOUNTER — Other Ambulatory Visit (HOSPITAL_COMMUNITY): Payer: Self-pay

## 2023-01-01 ENCOUNTER — Other Ambulatory Visit (HOSPITAL_BASED_OUTPATIENT_CLINIC_OR_DEPARTMENT_OTHER): Payer: Self-pay | Admitting: *Deleted

## 2023-01-01 ENCOUNTER — Other Ambulatory Visit (HOSPITAL_BASED_OUTPATIENT_CLINIC_OR_DEPARTMENT_OTHER): Payer: Self-pay

## 2023-01-01 DIAGNOSIS — E119 Type 2 diabetes mellitus without complications: Secondary | ICD-10-CM

## 2023-01-01 MED ORDER — METFORMIN HCL 1000 MG PO TABS
1000.0000 mg | ORAL_TABLET | Freq: Two times a day (BID) | ORAL | 0 refills | Status: DC
Start: 2023-01-01 — End: 2023-03-30
  Filled 2023-01-01 – 2023-01-02 (×2): qty 180, 90d supply, fill #0

## 2023-01-02 ENCOUNTER — Other Ambulatory Visit (HOSPITAL_COMMUNITY): Payer: Self-pay

## 2023-01-03 ENCOUNTER — Other Ambulatory Visit (HOSPITAL_COMMUNITY): Payer: Self-pay

## 2023-01-17 ENCOUNTER — Other Ambulatory Visit (HOSPITAL_COMMUNITY): Payer: Self-pay

## 2023-02-15 ENCOUNTER — Encounter (HOSPITAL_BASED_OUTPATIENT_CLINIC_OR_DEPARTMENT_OTHER): Payer: Self-pay | Admitting: Family Medicine

## 2023-03-30 ENCOUNTER — Other Ambulatory Visit (HOSPITAL_BASED_OUTPATIENT_CLINIC_OR_DEPARTMENT_OTHER): Payer: Self-pay | Admitting: Family Medicine

## 2023-03-30 DIAGNOSIS — E119 Type 2 diabetes mellitus without complications: Secondary | ICD-10-CM

## 2023-03-30 NOTE — Telephone Encounter (Signed)
 Copied from CRM [id number]. Topic: Clinical - Medication Refill >> Mar 30, 2023  5:00 PM Deleta HERO wrote: Most Recent Primary Care Visit:  Provider: TOWANA SMALL  Department: [facility]  Visit Type: FOLLOW UP  Date: 07/06/2022  Medication: ***  Has the patient contacted their pharmacy?  (Agent: If no, request that the patient contact the pharmacy for the refill. If patient does not wish to contact the pharmacy document the reason why and proceed with request.) (Agent: If yes, when and what did the pharmacy advise?)  Is this the correct pharmacy for this prescription?  If no, delete pharmacy and type the correct one.  This is the patient's preferred pharmacy:  [facility] [address] [place] [address] Phone: [phone] Fax: [phone]   Has the prescription been filled recently?   Is the patient out of the medication?   Has the patient been seen for an appointment in the last year OR does the patient have an upcoming appointment?   Can we respond through MyChart?   Agent: Please be advised that Rx refills may take up to 3 business days. We ask that you follow-up with your pharmacy.

## 2023-04-01 NOTE — Telephone Encounter (Signed)
 Copied from CRM [id number]. Topic: Clinical - Medication Refill >> Mar 30, 2023  5:00 PM Deleta HERO wrote: Most Recent Primary Care Visit:  Provider: TOWANA SMALL  Department: [facility]  Visit Type: FOLLOW UP  Date: 07/06/2022  Medication: metFORMIN  (GLUCOPHAGE ) 1000 MG tablet    Has the patient contacted their pharmacy? Yes (Agent: If no, request that the patient contact the pharmacy for the refill. If patient does not wish to contact the pharmacy document the reason why and proceed with request.) (Agent: If yes, when and what did the pharmacy advise?)  Is this the correct pharmacy for this prescription? Yes If no, delete pharmacy and type the correct one.  This is the patient's preferred pharmacy:  [facility] [address] [place] [address] Phone: [phone] Fax: [phone]   Has the prescription been filled recently? No  Is the patient out of the medication? Yes  Has the patient been seen for an appointment in the last year OR does the patient have an upcoming appointment? No  Can we respond through MyChart? No  Agent: Please be advised that Rx refills may take up to 3 business days. We ask that you follow-up with your pharmacy.   Medication refill

## 2023-04-02 ENCOUNTER — Other Ambulatory Visit (HOSPITAL_BASED_OUTPATIENT_CLINIC_OR_DEPARTMENT_OTHER): Payer: Self-pay

## 2023-04-02 MED ORDER — METFORMIN HCL 1000 MG PO TABS
1000.0000 mg | ORAL_TABLET | Freq: Two times a day (BID) | ORAL | 1 refills | Status: DC
Start: 2023-04-02 — End: 2023-11-07
  Filled 2023-04-02: qty 180, 90d supply, fill #0
  Filled 2023-05-12: qty 180, 90d supply, fill #1

## 2023-05-10 ENCOUNTER — Other Ambulatory Visit (HOSPITAL_BASED_OUTPATIENT_CLINIC_OR_DEPARTMENT_OTHER): Payer: Self-pay | Admitting: Family Medicine

## 2023-05-10 ENCOUNTER — Telehealth (HOSPITAL_BASED_OUTPATIENT_CLINIC_OR_DEPARTMENT_OTHER): Payer: Self-pay | Admitting: *Deleted

## 2023-05-10 ENCOUNTER — Other Ambulatory Visit (HOSPITAL_BASED_OUTPATIENT_CLINIC_OR_DEPARTMENT_OTHER): Payer: Self-pay

## 2023-05-10 DIAGNOSIS — E119 Type 2 diabetes mellitus without complications: Secondary | ICD-10-CM

## 2023-05-10 NOTE — Telephone Encounter (Signed)
Copied from CRM [id number]. Topic: Clinical - Medication Refill >> May 10, 2023  1:12 PM [name] wrote: Most Recent Primary Care Visit:  Provider: Alyson Reedy  Department: [facility]  Visit Type: FOLLOW UP  Date: 07/06/2022  Medication: metFORMIN (GLUCOPHAGE) 1000 MG tablet  Has the patient contacted their pharmacy? No (Agent: If no, request that the patient contact the pharmacy for the refill. If patient does not wish to contact the pharmacy document the reason why and proceed with request.) (Agent: If yes, when and what did the pharmacy advise?)  Is this the correct pharmacy for this prescription? Yes If no, delete pharmacy and type the correct one.  This is the patient's preferred pharmacy:  [facility] [address] [place] [address] Phone: [phone] Fax: [phone]   Has the prescription been filled recently? No  Is the patient out of the medication? No  Has the patient been seen for an appointment in the last year OR does the patient have an upcoming appointment? Yes  Can we respond through MyChart? Yes  Agent: Please be advised that Rx refills may take up to 3 business days. We ask that you follow-up with your pharmacy.

## 2023-05-10 NOTE — Telephone Encounter (Signed)
Patient needs to schedule an appointment with provider to have A1c checked please schedule

## 2023-05-10 NOTE — Telephone Encounter (Signed)
Copied from CRM [id number]. Topic: Clinical - Request for Lab/Test Order >> May 10, 2023  1:10 PM [name] wrote: Reason for CRM: Patient would like to get his A1c checked before going out of town on the 24th. Call back number is [phone] to speak with his niece Lorra Hals who is on [place]

## 2023-05-10 NOTE — Telephone Encounter (Signed)
Refill remaining for requested medication, per chart. This RN called pharmacy and was advised that it is too early for a refill, as a 90 day supply was filled about a month ago.

## 2023-05-12 ENCOUNTER — Other Ambulatory Visit (HOSPITAL_COMMUNITY): Payer: Self-pay

## 2023-05-14 ENCOUNTER — Other Ambulatory Visit (HOSPITAL_BASED_OUTPATIENT_CLINIC_OR_DEPARTMENT_OTHER): Payer: Self-pay

## 2023-05-16 ENCOUNTER — Other Ambulatory Visit (HOSPITAL_COMMUNITY): Payer: Self-pay

## 2023-07-31 ENCOUNTER — Ambulatory Visit (HOSPITAL_BASED_OUTPATIENT_CLINIC_OR_DEPARTMENT_OTHER): Payer: BLUE CROSS/BLUE SHIELD | Admitting: Family Medicine

## 2023-11-07 ENCOUNTER — Other Ambulatory Visit (HOSPITAL_BASED_OUTPATIENT_CLINIC_OR_DEPARTMENT_OTHER): Payer: Self-pay

## 2023-11-07 ENCOUNTER — Telehealth (HOSPITAL_BASED_OUTPATIENT_CLINIC_OR_DEPARTMENT_OTHER): Payer: Self-pay | Admitting: Family Medicine

## 2023-11-07 DIAGNOSIS — E119 Type 2 diabetes mellitus without complications: Secondary | ICD-10-CM

## 2023-11-07 MED ORDER — METFORMIN HCL 1000 MG PO TABS
1000.0000 mg | ORAL_TABLET | Freq: Two times a day (BID) | ORAL | 1 refills | Status: DC
Start: 2023-11-07 — End: 2023-12-20
  Filled 2023-11-07 (×2): qty 180, 90d supply, fill #0

## 2023-11-07 NOTE — Telephone Encounter (Signed)
 Metformin  [facility]

## 2023-11-07 NOTE — Telephone Encounter (Signed)
 Pt came in with daughter requesting a refill on metformin  please advise

## 2023-11-10 ENCOUNTER — Other Ambulatory Visit (HOSPITAL_BASED_OUTPATIENT_CLINIC_OR_DEPARTMENT_OTHER): Payer: Self-pay

## 2023-11-13 ENCOUNTER — Other Ambulatory Visit: Payer: Self-pay

## 2023-11-13 ENCOUNTER — Other Ambulatory Visit (HOSPITAL_BASED_OUTPATIENT_CLINIC_OR_DEPARTMENT_OTHER): Payer: Self-pay

## 2023-12-12 ENCOUNTER — Ambulatory Visit (HOSPITAL_BASED_OUTPATIENT_CLINIC_OR_DEPARTMENT_OTHER): Admitting: Family Medicine

## 2023-12-20 ENCOUNTER — Encounter (HOSPITAL_BASED_OUTPATIENT_CLINIC_OR_DEPARTMENT_OTHER): Payer: Self-pay | Admitting: Family Medicine

## 2023-12-20 ENCOUNTER — Ambulatory Visit (INDEPENDENT_AMBULATORY_CARE_PROVIDER_SITE_OTHER): Admitting: Family Medicine

## 2023-12-20 VITALS — BP 102/68 | HR 92 | Ht 61.5 in | Wt 151.8 lb

## 2023-12-20 DIAGNOSIS — E119 Type 2 diabetes mellitus without complications: Secondary | ICD-10-CM

## 2023-12-20 DIAGNOSIS — K219 Gastro-esophageal reflux disease without esophagitis: Secondary | ICD-10-CM | POA: Insufficient documentation

## 2023-12-20 DIAGNOSIS — Z7984 Long term (current) use of oral hypoglycemic drugs: Secondary | ICD-10-CM | POA: Diagnosis not present

## 2023-12-20 DIAGNOSIS — E785 Hyperlipidemia, unspecified: Secondary | ICD-10-CM | POA: Diagnosis not present

## 2023-12-20 MED ORDER — ATORVASTATIN CALCIUM 20 MG PO TABS: 20.0000 mg | ORAL_TABLET | Freq: Every day | ORAL | 3 refills | Status: DC

## 2023-12-20 MED ORDER — OMEPRAZOLE 20 MG PO CPDR: 20.0000 mg | DELAYED_RELEASE_CAPSULE | Freq: Every day | ORAL | 1 refills | Status: DC

## 2023-12-20 MED ORDER — METFORMIN HCL 1000 MG PO TABS: 1000.0000 mg | ORAL_TABLET | Freq: Two times a day (BID) | ORAL | 3 refills | Status: DC

## 2023-12-20 NOTE — Assessment & Plan Note (Signed)
 Patient continues with atorvastatin , has been tolerating well, denies any issues with myalgias.  Is requesting refill of medications today, refill provided Overdue for follow-up on labs, we will check lipid panel today

## 2023-12-20 NOTE — Assessment & Plan Note (Signed)
 Prior hemoglobin A1c readings have generally been at goal - however patient is very overdue for follow-up.  He continues with metformin , denies any issues today.  He is requesting refill of medication. Foot exam completed today.  We will proceed with checking hemoglobin A1c today for monitoring, we will also update urine microalbumin/creatinine ratio Refill for metformin  was sent to pharmacy on file

## 2023-12-20 NOTE — Progress Notes (Signed)
    Procedures performed today:    None.  Independent interpretation of notes and tests performed by another provider:   None.  Brief History, Exam, Impression, and Recommendations:    BP 102/68 (BP Location: Right Arm, Patient Position: Sitting, Cuff Size: Normal)   Pulse 92   Ht 5' 1.5 (1.562 m)   Wt 151 lb 12.8 oz (68.9 kg)   SpO2 93%   BMI 28.22 kg/m   Type 2 diabetes mellitus without complication, without long-term current use of insulin (HCC) Assessment & Plan: Prior hemoglobin A1c readings have generally been at goal - however patient is very overdue for follow-up.  He continues with metformin , denies any issues today.  He is requesting refill of medication. Foot exam completed today.  We will proceed with checking hemoglobin A1c today for monitoring, we will also update urine microalbumin/creatinine ratio Refill for metformin  was sent to pharmacy on file  Orders: -     Microalbumin / creatinine urine ratio -     metFORMIN  HCl; Take 1 tablet (1,000 mg total) by mouth 2 (two) times daily with a meal.  Dispense: 180 tablet; Refill: 3 -     Hemoglobin A1c -     Basic metabolic panel with GFR  Gastroesophageal reflux disease, unspecified whether esophagitis present Assessment & Plan: Has been dealing with reflux symptoms. Not necessarily worse with any particular foods. Not taking any medications for this. Notes sour taste in his mouth at times. Can proceed with PPI today. We will also proceed with referral to GI for further evaluation.  Orders: -     Ambulatory referral to Gastroenterology  Dyslipidemia Assessment & Plan: Patient continues with atorvastatin , has been tolerating well, denies any issues with myalgias.  Is requesting refill of medications today, refill provided Overdue for follow-up on labs, we will check lipid panel today  Orders: -     Atorvastatin  Calcium ; Take 1 tablet (20 mg total) by mouth daily.  Dispense: 90 tablet; Refill: 3 -     Lipid  panel  Other orders -     Omeprazole ; Take 1 capsule (20 mg total) by mouth daily.  Dispense: 90 capsule; Refill: 1  Provide patient with phone number for [facility] so that he may call and schedule follow-up   Return in about 3 months (around 03/20/2024) for diabetes, hyperlipidemia.   ___________________________________________ David Ehrman de [place], MD, ABFM, CAQSM Primary Care and Sports Medicine [facility]

## 2023-12-20 NOTE — Assessment & Plan Note (Signed)
 Has been dealing with reflux symptoms. Not necessarily worse with any particular foods. Not taking any medications for this. Notes sour taste in his mouth at times. Can proceed with PPI today. We will also proceed with referral to GI for further evaluation.

## 2023-12-20 NOTE — Patient Instructions (Addendum)
  Medication Instructions:  Your physician recommends that you continue on your current medications as directed. Please refer to the Current Medication list given to you today. --If you need a refill on any your medications before your next appointment, please call your pharmacy first. If no refills are authorized on file call the office.-- Lab Work: Your physician has recommended that you have lab work today: today If you have labs (blood work) drawn today and your tests are completely normal, you will receive your results via MyChart message OR a phone call from our staff.  Please ensure you check your voicemail in the event that you authorized detailed messages to be left on a delegated number. If you have any lab test that is abnormal or we need to change your treatment, we will call you to review the results.  Referrals/Procedures/Imaging: [facility] [phone]  Follow-Up: Your next appointment:   Your physician recommends that you schedule a follow-up appointment in: 3 month follow up  with Dr. de Peru  You will receive a text message or e-mail with a link to a survey about your care and experience with us  today! We would greatly appreciate your feedback!   Thanks for letting us  be apart of your health journey!!  Primary Care and Sports Medicine   Dr. Quintin sheerer Peru   We encourage you to activate your patient portal called MyChart.  Sign up information is provided on this After Visit Summary.  MyChart is used to connect with patients for Virtual Visits (Telemedicine).  Patients are able to view lab/test results, encounter notes, upcoming appointments, etc.  Non-urgent messages can be sent to your provider as well. To learn more about what you can do with MyChart, please visit --  [email or web address].

## 2023-12-21 LAB — MICROALBUMIN / CREATININE URINE RATIO
Creatinine, Urine: 167.9 mg/dL
Microalb/Creat Ratio: 19 mg/g{creat} (ref 0–29)
Microalbumin, Urine: 32.3 ug/mL

## 2023-12-24 ENCOUNTER — Ambulatory Visit (HOSPITAL_BASED_OUTPATIENT_CLINIC_OR_DEPARTMENT_OTHER): Payer: Self-pay | Admitting: Family Medicine

## 2023-12-25 ENCOUNTER — Other Ambulatory Visit (HOSPITAL_BASED_OUTPATIENT_CLINIC_OR_DEPARTMENT_OTHER): Payer: Self-pay

## 2023-12-25 ENCOUNTER — Other Ambulatory Visit (HOSPITAL_BASED_OUTPATIENT_CLINIC_OR_DEPARTMENT_OTHER): Payer: Self-pay | Admitting: *Deleted

## 2023-12-25 ENCOUNTER — Telehealth (HOSPITAL_BASED_OUTPATIENT_CLINIC_OR_DEPARTMENT_OTHER): Payer: Self-pay | Admitting: *Deleted

## 2023-12-25 DIAGNOSIS — E785 Hyperlipidemia, unspecified: Secondary | ICD-10-CM

## 2023-12-25 DIAGNOSIS — E119 Type 2 diabetes mellitus without complications: Secondary | ICD-10-CM

## 2023-12-25 MED ORDER — ATORVASTATIN CALCIUM 20 MG PO TABS
20.0000 mg | ORAL_TABLET | Freq: Every day | ORAL | 3 refills | Status: DC
  Filled 2023-12-25: qty 90, 90d supply, fill #0

## 2023-12-25 MED ORDER — OMEPRAZOLE 20 MG PO CPDR
20.0000 mg | DELAYED_RELEASE_CAPSULE | Freq: Every day | ORAL | 1 refills | Status: DC
  Filled 2023-12-25: qty 90, 90d supply, fill #0

## 2023-12-25 MED ORDER — METFORMIN HCL 1000 MG PO TABS
1000.0000 mg | ORAL_TABLET | Freq: Two times a day (BID) | ORAL | 3 refills | Status: DC
  Filled 2023-12-25: qty 180, 90d supply, fill #0

## 2023-12-25 NOTE — Telephone Encounter (Signed)
 Copied from CRM #[id number]. Topic: Clinical - Prescription Issue >> Dec 25, 2023 12:52 PM Fonda T wrote: Reason for CRM: Received call from niece of patient, Charleen, [place] verified, states all medications were sent to incorrect pharmacy on 12/20/23.  States all medications needs to be sent to correct preferred pharmacy listed.  Medications:  atorvastatin  (LIPITOR) 20 MG tablet metFORMIN  (GLUCOPHAGE ) 1000 MG tablet omeprazole  (PRILOSEC) 20 MG capsule   Preferred pharmacy:  [facility] [address] [place] [address] Phone: [phone] Fax: [phone]   Patient niece requesting a return call once medications have been sent to correct pharmacy, can be reached at [phone].

## 2023-12-25 NOTE — Telephone Encounter (Signed)
 Please let patient niece know this medication has been sent downstairs for patient

## 2024-03-02 ENCOUNTER — Observation Stay (HOSPITAL_COMMUNITY)
Admission: EM | Admit: 2024-03-02 | Discharge: 2024-03-04 | Disposition: A | Discharge status: Home / Self Care | Payer: Self-pay | Attending: General Surgery | Admitting: General Surgery

## 2024-03-02 ENCOUNTER — Encounter (HOSPITAL_COMMUNITY): Payer: Self-pay | Admitting: General Surgery

## 2024-03-02 ENCOUNTER — Emergency Department (HOSPITAL_COMMUNITY): Payer: Self-pay

## 2024-03-02 ENCOUNTER — Other Ambulatory Visit: Payer: Self-pay

## 2024-03-02 DIAGNOSIS — E785 Hyperlipidemia, unspecified: Secondary | ICD-10-CM

## 2024-03-02 DIAGNOSIS — E119 Type 2 diabetes mellitus without complications: Secondary | ICD-10-CM

## 2024-03-02 DIAGNOSIS — S2241XA Multiple fractures of ribs, right side, initial encounter for closed fracture: Secondary | ICD-10-CM

## 2024-03-02 DIAGNOSIS — S2221XA Fracture of manubrium, initial encounter for closed fracture: Principal | ICD-10-CM

## 2024-03-02 DIAGNOSIS — S2249XA Multiple fractures of ribs, unspecified side, initial encounter for closed fracture: Secondary | ICD-10-CM | POA: Diagnosis present

## 2024-03-02 LAB — COMPREHENSIVE METABOLIC PANEL WITH GFR
ALT: 17 U/L (ref 0–44)
AST: 26 U/L (ref 15–41)
Albumin: 4.1 g/dL (ref 3.5–5.0)
Alkaline Phosphatase: 64 U/L (ref 38–126)
Anion gap: 9 (ref 5–15)
BUN: 14 mg/dL (ref 8–23)
CO2: 25 mmol/L (ref 22–32)
Calcium: 9.3 mg/dL (ref 8.9–10.3)
Chloride: 105 mmol/L (ref 98–111)
Creatinine, Ser: 0.83 mg/dL (ref 0.61–1.24)
GFR, Estimated: >60 mL/min (ref >60)
Glucose, Bld: 108 mg/dL — ABNORMAL HIGH (ref 70–99)
Potassium: 3.9 mmol/L (ref 3.5–5.1)
Sodium: 139 mmol/L (ref 135–145)
Total Bilirubin: 0.6 mg/dL (ref 0.0–1.2)
Total Protein: 6.9 g/dL (ref 6.5–8.1)

## 2024-03-02 LAB — GLUCOSE, CAPILLARY
Glucose-Capillary: 259 mg/dL — ABNORMAL HIGH (ref 70–99)
Glucose-Capillary: 187 mg/dL — ABNORMAL HIGH (ref 70–99)
Glucose-Capillary: 140 mg/dL — ABNORMAL HIGH (ref 70–99)

## 2024-03-02 LAB — CBC
HCT: 42.4 % (ref 39.0–52.0)
HCT: 39.5 % (ref 39.0–52.0)
Hemoglobin: 14.1 g/dL (ref 13.0–17.0)
Hemoglobin: 13.2 g/dL (ref 13.0–17.0)
MCH: 29.6 pg (ref 26.0–34.0)
MCH: 29.7 pg (ref 26.0–34.0)
MCHC: 33.3 g/dL (ref 30.0–36.0)
MCHC: 33.4 g/dL (ref 30.0–36.0)
MCV: 88.9 fL (ref 80.0–100.0)
MCV: 88.8 fL (ref 80.0–100.0)
Platelets: 293 K/uL (ref 150–400)
Platelets: 288 K/uL (ref 150–400)
RBC: 4.77 MIL/uL (ref 4.22–5.81)
RBC: 4.45 MIL/uL (ref 4.22–5.81)
RDW: 12 % (ref 11.5–15.5)
RDW: 12 % (ref 11.5–15.5)
WBC: 10.7 K/uL — ABNORMAL HIGH (ref 4.0–10.5)
WBC: 9.9 K/uL (ref 4.0–10.5)
nRBC: 0 % (ref 0.0–0.2)
nRBC: 0 % (ref 0.0–0.2)

## 2024-03-02 LAB — HIV ANTIBODY (ROUTINE TESTING W REFLEX): HIV Screen 4th Generation wRfx: NONREACTIVE

## 2024-03-02 LAB — I-STAT CHEM 8, ED
BUN: 17 mg/dL (ref 8–23)
Calcium, Ion: 1.06 mmol/L — ABNORMAL LOW (ref 1.15–1.40)
Chloride: 105 mmol/L (ref 98–111)
Creatinine, Ser: 0.8 mg/dL (ref 0.61–1.24)
Glucose, Bld: 111 mg/dL — ABNORMAL HIGH (ref 70–99)
HCT: 44 % (ref 39.0–52.0)
Hemoglobin: 15 g/dL (ref 13.0–17.0)
Potassium: 3.9 mmol/L (ref 3.5–5.1)
Sodium: 140 mmol/L (ref 135–145)
TCO2: 24 mmol/L (ref 22–32)

## 2024-03-02 LAB — TROPONIN I (HIGH SENSITIVITY): Troponin I (High Sensitivity): 18 ng/L — ABNORMAL HIGH (ref <18)

## 2024-03-02 LAB — CREATININE, SERUM
Creatinine, Ser: 1.01 mg/dL (ref 0.61–1.24)
GFR, Estimated: >60 mL/min (ref >60)

## 2024-03-02 LAB — CBG MONITORING, ED: Glucose-Capillary: 107 mg/dL — ABNORMAL HIGH (ref 70–99)

## 2024-03-02 LAB — HEMOGLOBIN A1C
Hgb A1c MFr Bld: 6.2 % — ABNORMAL HIGH (ref 4.8–5.6)
Mean Plasma Glucose: 131.24 mg/dL

## 2024-03-02 MED ORDER — METOPROLOL TARTRATE 5 MG/5ML IV SOLN: 5.0000 mg | Freq: Four times a day (QID) | INTRAVENOUS | Status: DC | PRN

## 2024-03-02 MED ORDER — IBUPROFEN 600 MG PO TABS
600.0000 mg | ORAL_TABLET | Freq: Four times a day (QID) | ORAL | Status: DC
  Administered 2024-03-02 – 2024-03-04 (×8): 600 mg via ORAL
  Filled 2024-03-02 (×8): qty 1

## 2024-03-02 MED ORDER — ENOXAPARIN SODIUM 30 MG/0.3ML IJ SOSY
30.0000 mg | PREFILLED_SYRINGE | Freq: Two times a day (BID) | INTRAMUSCULAR | Status: DC
  Administered 2024-03-04: 30 mg via SUBCUTANEOUS
  Filled 2024-03-02: qty 0.3

## 2024-03-02 MED ORDER — DOCUSATE SODIUM 100 MG PO CAPS
100.0000 mg | ORAL_CAPSULE | Freq: Two times a day (BID) | ORAL | Status: DC
  Administered 2024-03-02 – 2024-03-04 (×4): 100 mg via ORAL
  Filled 2024-03-02 (×4): qty 1

## 2024-03-02 MED ORDER — GUAIFENESIN-DM 100-10 MG/5ML PO SYRP
5.0000 mL | ORAL_SOLUTION | ORAL | Status: DC | PRN
  Administered 2024-03-02 – 2024-03-03 (×2): 5 mL via ORAL
  Filled 2024-03-02 (×2): qty 5

## 2024-03-02 MED ORDER — INSULIN ASPART 100 UNIT/ML IJ SOLN
0.0000 [IU] | Freq: Three times a day (TID) | INTRAMUSCULAR | Status: DC
  Administered 2024-03-03: 3 [IU] via SUBCUTANEOUS
  Administered 2024-03-03: 2 [IU] via SUBCUTANEOUS
  Administered 2024-03-04: 5 [IU] via SUBCUTANEOUS
  Filled 2024-03-02: qty 5
  Filled 2024-03-02: qty 2
  Filled 2024-03-02: qty 3

## 2024-03-02 MED ORDER — METHOCARBAMOL 500 MG PO TABS
500.0000 mg | ORAL_TABLET | Freq: Three times a day (TID) | ORAL | Status: DC
  Administered 2024-03-02 – 2024-03-04 (×6): 500 mg via ORAL
  Filled 2024-03-02 (×6): qty 1

## 2024-03-02 MED ORDER — OXYCODONE HCL 5 MG PO TABS: 10.0000 mg | ORAL_TABLET | ORAL | Status: DC | PRN

## 2024-03-02 MED ORDER — ONDANSETRON HCL 4 MG/2ML IJ SOLN: 4.0000 mg | Freq: Four times a day (QID) | INTRAMUSCULAR | Status: DC | PRN

## 2024-03-02 MED ORDER — MORPHINE SULFATE (PF) 4 MG/ML IV SOLN
4.0000 mg | Freq: Once | INTRAVENOUS | Status: AC
  Administered 2024-03-02: 4 mg via INTRAVENOUS
  Filled 2024-03-02: qty 1

## 2024-03-02 MED ORDER — HYDRALAZINE HCL 20 MG/ML IJ SOLN: 10.0000 mg | INTRAMUSCULAR | Status: DC | PRN

## 2024-03-02 MED ORDER — IOHEXOL 350 MG/ML SOLN
75.0000 mL | Freq: Once | INTRAVENOUS | Status: AC | PRN
  Administered 2024-03-02: 75 mL via INTRAVENOUS

## 2024-03-02 MED ORDER — OXYCODONE HCL 5 MG PO TABS
5.0000 mg | ORAL_TABLET | ORAL | Status: DC | PRN
  Administered 2024-03-02: 5 mg via ORAL
  Filled 2024-03-02: qty 1

## 2024-03-02 MED ORDER — ACETAMINOPHEN 500 MG PO TABS
1000.0000 mg | ORAL_TABLET | Freq: Four times a day (QID) | ORAL | Status: DC
  Administered 2024-03-02 – 2024-03-04 (×8): 1000 mg via ORAL
  Filled 2024-03-02 (×8): qty 2

## 2024-03-02 MED ORDER — POLYETHYLENE GLYCOL 3350 17 G PO PACK: 17.0000 g | PACK | Freq: Every day | ORAL | Status: DC | PRN

## 2024-03-02 MED ORDER — HYDROMORPHONE HCL 1 MG/ML IJ SOLN: 1.0000 mg | INTRAMUSCULAR | Status: DC | PRN

## 2024-03-02 MED ORDER — METHOCARBAMOL 1000 MG/10ML IJ SOLN: 500.0000 mg | Freq: Three times a day (TID) | INTRAMUSCULAR | Status: DC

## 2024-03-02 MED ORDER — ONDANSETRON 4 MG PO TBDP: 4.0000 mg | ORAL_TABLET | Freq: Four times a day (QID) | ORAL | Status: DC | PRN
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
## 2024-03-02 NOTE — Progress Notes (Signed)
 Utilized [employer] device, [employer], Lam [phone] to administer medications and answer patient's questions.

## 2024-03-02 NOTE — Plan of Care (Signed)
  Problem: Education: Goal: Ability to describe self-care measures that may prevent or decrease complications (Diabetes Survival Skills Education) will improve Outcome: Progressing   Problem: Skin Integrity: Goal: Risk for impaired skin integrity will decrease Outcome: Progressing   Problem: Activity: Goal: Risk for activity intolerance will decrease Outcome: Progressing   Problem: Nutrition: Goal: Adequate nutrition will be maintained Outcome: Progressing
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
## 2024-03-03 ENCOUNTER — Observation Stay (HOSPITAL_COMMUNITY)

## 2024-03-03 ENCOUNTER — Observation Stay (HOSPITAL_COMMUNITY): Payer: Self-pay

## 2024-03-03 DIAGNOSIS — I10 Essential (primary) hypertension: Secondary | ICD-10-CM

## 2024-03-03 LAB — BASIC METABOLIC PANEL WITH GFR
Anion gap: 14 (ref 5–15)
BUN: 20 mg/dL (ref 8–23)
CO2: 23 mmol/L (ref 22–32)
Calcium: 9 mg/dL (ref 8.9–10.3)
Chloride: 100 mmol/L (ref 98–111)
Creatinine, Ser: 1.09 mg/dL (ref 0.61–1.24)
GFR, Estimated: >60 mL/min (ref >60)
Glucose, Bld: 125 mg/dL — ABNORMAL HIGH (ref 70–99)
Potassium: 4 mmol/L (ref 3.5–5.1)
Sodium: 137 mmol/L (ref 135–145)

## 2024-03-03 LAB — CBC
HCT: 36.4 % — ABNORMAL LOW (ref 39.0–52.0)
Hemoglobin: 12.4 g/dL — ABNORMAL LOW (ref 13.0–17.0)
MCH: 29.9 pg (ref 26.0–34.0)
MCHC: 34.1 g/dL (ref 30.0–36.0)
MCV: 87.7 fL (ref 80.0–100.0)
Platelets: 273 K/uL (ref 150–400)
RBC: 4.15 MIL/uL — ABNORMAL LOW (ref 4.22–5.81)
RDW: 12.3 % (ref 11.5–15.5)
WBC: 8.8 K/uL (ref 4.0–10.5)
nRBC: 0 % (ref 0.0–0.2)

## 2024-03-03 LAB — GLUCOSE, CAPILLARY
Glucose-Capillary: 172 mg/dL — ABNORMAL HIGH (ref 70–99)
Glucose-Capillary: 120 mg/dL — ABNORMAL HIGH (ref 70–99)
Glucose-Capillary: 130 mg/dL — ABNORMAL HIGH (ref 70–99)
Glucose-Capillary: 129 mg/dL — ABNORMAL HIGH (ref 70–99)

## 2024-03-03 LAB — ECHOCARDIOGRAM COMPLETE
Area-P 1/2: 3.27 cm2
Height: 62.598 in
S' Lateral: 2.7 cm

## 2024-03-03 MED ORDER — ATORVASTATIN CALCIUM 10 MG PO TABS
20.0000 mg | ORAL_TABLET | Freq: Every day | ORAL | Status: DC
  Administered 2024-03-03 – 2024-03-04 (×2): 20 mg via ORAL
  Filled 2024-03-03 (×2): qty 2

## 2024-03-03 MED ORDER — GUAIFENESIN ER 600 MG PO TB12
600.0000 mg | ORAL_TABLET | Freq: Two times a day (BID) | ORAL | Status: DC
  Administered 2024-03-03 – 2024-03-04 (×3): 600 mg via ORAL
  Filled 2024-03-03 (×3): qty 1

## 2024-03-03 MED ORDER — HYDROMORPHONE HCL 1 MG/ML IJ SOLN: 0.5000 mg | INTRAMUSCULAR | Status: DC | PRN

## 2024-03-03 MED ORDER — OXYCODONE HCL 5 MG PO TABS: 5.0000 mg | ORAL_TABLET | ORAL | Status: DC | PRN

## 2024-03-03 MED ORDER — PERFLUTREN LIPID MICROSPHERE
1.0000 mL | INTRAVENOUS | Status: AC | PRN
  Administered 2024-03-03: 3 mL via INTRAVENOUS

## 2024-03-03 NOTE — Plan of Care (Signed)
  Problem: Coping: Goal: Ability to adjust to condition or change in health will improve Outcome: Progressing   Problem: Education: Goal: Individualized Educational Video(s) Outcome: Progressing   Problem: Education: Goal: Ability to describe self-care measures that may prevent or decrease complications (Diabetes Survival Skills Education) will improve Outcome: Progressing   Problem: Health Behavior/Discharge Planning: Goal: Ability to identify and utilize available resources and services will improve Outcome: Progressing   Problem: Health Behavior/Discharge Planning: Goal: Ability to manage health-related needs will improve Outcome: Progressing   Problem: Nutritional: Goal: Maintenance of adequate nutrition will improve Outcome: Progressing   Problem: Skin Integrity: Goal: Risk for impaired skin integrity will decrease Outcome: Progressing   Problem: Clinical Measurements: Goal: Will remain free from infection Outcome: Progressing   Problem: Activity: Goal: Risk for activity intolerance will decrease Outcome: Progressing   Problem: Nutrition: Goal: Adequate nutrition will be maintained Outcome: Progressing

## 2024-03-03 NOTE — Progress Notes (Signed)
 PT Cancellation Note  Patient Details Name: David Morales MRN: [id number] DOB: 10-01-1955   Cancelled Treatment:    Reason Eval/Treat Not Completed: PT screened, no needs identified, will sign off Pt independent per OT.  Aleck Daring, PT, DPT Acute Rehabilitation Services Office [phone]    Aleck ONEIDA Daring 03/03/2024, 12:25 PM
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
## 2024-03-03 NOTE — Progress Notes (Signed)
 Echocardiogram 2D Echocardiogram has been performed.  David Morales 03/03/2024, 3:09 PM
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
## 2024-03-04 ENCOUNTER — Other Ambulatory Visit (HOSPITAL_COMMUNITY): Payer: Self-pay

## 2024-03-04 LAB — GLUCOSE, CAPILLARY
Glucose-Capillary: 213 mg/dL — ABNORMAL HIGH (ref 70–99)
Glucose-Capillary: 165 mg/dL — ABNORMAL HIGH (ref 70–99)

## 2024-03-04 MED ORDER — METFORMIN HCL 1000 MG PO TABS
1000.0000 mg | ORAL_TABLET | Freq: Two times a day (BID) | ORAL | 0 refills | Status: DC
  Filled 2024-03-04: qty 60, 30d supply, fill #0

## 2024-03-04 MED ORDER — ATORVASTATIN CALCIUM 20 MG PO TABS
20.0000 mg | ORAL_TABLET | Freq: Every day | ORAL | 0 refills | Status: DC
  Filled 2024-03-04: qty 30, 30d supply, fill #0

## 2024-03-04 MED ORDER — ACETAMINOPHEN 500 MG PO TABS
1000.0000 mg | ORAL_TABLET | Freq: Three times a day (TID) | ORAL | 0 refills | Status: AC | PRN
  Filled 2024-03-04: qty 30, 5d supply, fill #0

## 2024-03-04 MED ORDER — METHOCARBAMOL 500 MG PO TABS
500.0000 mg | ORAL_TABLET | Freq: Three times a day (TID) | ORAL | 0 refills | Status: AC | PRN
  Filled 2024-03-04: qty 60, 20d supply, fill #0

## 2024-03-04 MED ORDER — OMEPRAZOLE 20 MG PO CPDR
20.0000 mg | DELAYED_RELEASE_CAPSULE | Freq: Every day | ORAL | 0 refills | Status: DC
  Filled 2024-03-04: qty 30, 30d supply, fill #0

## 2024-03-04 MED ORDER — DOCUSATE SODIUM 100 MG PO CAPS
100.0000 mg | ORAL_CAPSULE | Freq: Two times a day (BID) | ORAL | 0 refills | Status: AC | PRN
  Filled 2024-03-04: qty 20, 10d supply, fill #0

## 2024-03-04 MED ORDER — OXYCODONE HCL 5 MG PO TABS
5.0000 mg | ORAL_TABLET | Freq: Four times a day (QID) | ORAL | 0 refills | Status: AC | PRN
  Filled 2024-03-04: qty 15, 4d supply, fill #0

## 2024-03-04 MED ORDER — POLYETHYLENE GLYCOL 3350 17 GM/SCOOP PO POWD
17.0000 g | Freq: Every day | ORAL | 0 refills | Status: AC | PRN
  Filled 2024-03-04: qty 238, 14d supply, fill #0

## 2024-03-04 NOTE — Plan of Care (Signed)
 Using Ipad interpreter, patient assessed accordingly.  AVS printed out and instructed to patient with some questions ask and answered. Needs attended. PIV removed. Problem: Education: Goal: Ability to describe self-care measures that may prevent or decrease complications (Diabetes Survival Skills Education) will improve Outcome: Progressing Goal: Individualized Educational Video(s) Outcome: Progressing   Problem: Coping: Goal: Ability to adjust to condition or change in health will improve Outcome: Progressing   Problem: Fluid Volume: Goal: Ability to maintain a balanced intake and output will improve Outcome: Progressing   Problem: Health Behavior/Discharge Planning: Goal: Ability to identify and utilize available resources and services will improve Outcome: Progressing Goal: Ability to manage health-related needs will improve Outcome: Progressing   Problem: Metabolic: Goal: Ability to maintain appropriate glucose levels will improve Outcome: Progressing   Problem: Nutritional: Goal: Maintenance of adequate nutrition will improve Outcome: Progressing Goal: Progress toward achieving an optimal weight will improve Outcome: Progressing   Problem: Skin Integrity: Goal: Risk for impaired skin integrity will decrease Outcome: Progressing   Problem: Tissue Perfusion: Goal: Adequacy of tissue perfusion will improve Outcome: Progressing   Problem: Education: Goal: Knowledge of General Education information will improve Description: Including pain rating scale, medication(s)/side effects and non-pharmacologic comfort measures Outcome: Progressing   Problem: Health Behavior/Discharge Planning: Goal: Ability to manage health-related needs will improve Outcome: Progressing   Problem: Clinical Measurements: Goal: Ability to maintain clinical measurements within normal limits will improve Outcome: Progressing Goal: Will remain free from infection Outcome: Progressing Goal:  Diagnostic test results will improve Outcome: Progressing Goal: Respiratory complications will improve Outcome: Progressing Goal: Cardiovascular complication will be avoided Outcome: Progressing   Problem: Activity: Goal: Risk for activity intolerance will decrease Outcome: Progressing   Problem: Nutrition: Goal: Adequate nutrition will be maintained Outcome: Progressing   Problem: Coping: Goal: Level of anxiety will decrease Outcome: Progressing   Problem: Elimination: Goal: Will not experience complications related to bowel motility Outcome: Progressing Goal: Will not experience complications related to urinary retention Outcome: Progressing   Problem: Pain Managment: Goal: General experience of comfort will improve and/or be controlled Outcome: Progressing   Problem: Safety: Goal: Ability to remain free from injury will improve Outcome: Progressing   Problem: Skin Integrity: Goal: Risk for impaired skin integrity will decrease Outcome: Progressing

## 2024-03-04 NOTE — TOC CM/SW Note (Signed)
 MATCH Medication Assistance Card *Pharmacies please call [phone] for claim processing assistance Name: David Morales                                                                                                                                                                                         Relationship Code:  1 ID (MRN): [id number]                                                                                                                                                                                   Person Code:  [id number] Bin: [id number] RX Group: [id number] Discharge Date: 03/04/2024                                 RX PCN:  [id number] Expiration Date:03/12/2024                                            (must be filled within 7 days of discharge)     You have been approved to have the prescriptions written by your discharging physician filled through our [facility] (Medication Assistance Through [facility]) program. This program allows for a one-time (no refills) 34-day supply of selected medications for a low copay amount.  The copay is $0 per prescription.   Only certain pharmacies are participating in this program with [facility]. You will need to select one of the pharmacies from the attached list and take your prescriptions, this letter, and your photo ID to one of  the [facility] pharmacies.  We are excited that you are able to use the [facility] program to get your medications. These  prescriptions must be filled within 7 days of hospital discharge or they will no longer be valid for the [facility] program. Should you have any problems with your prescriptions please contact your case management team member at [phone] for [place].  Thank you,   [facility] Health Care Management  Mliss MICAEL Fass, RN, BSN  Trauma/Neuro ICU Case Manager [phone]

## 2024-03-04 NOTE — Plan of Care (Signed)
 Pt is alert and oriented x 4. Up adlib. Last bm 12/8. Pain moderate to chest/ribs. Scheduled pain medication tylenol , motrin  and robaxin  given and effective. Vitals stable. Translator used for med administration and assessment.  Problem: Education: Goal: Ability to describe self-care measures that may prevent or decrease complications (Diabetes Survival Skills Education) will improve Outcome: Progressing Goal: Individualized Educational Video(s) Outcome: Progressing   Problem: Coping: Goal: Ability to adjust to condition or change in health will improve Outcome: Progressing   Problem: Fluid Volume: Goal: Ability to maintain a balanced intake and output will improve Outcome: Progressing   Problem: Health Behavior/Discharge Planning: Goal: Ability to identify and utilize available resources and services will improve Outcome: Progressing Goal: Ability to manage health-related needs will improve Outcome: Progressing   Problem: Metabolic: Goal: Ability to maintain appropriate glucose levels will improve Outcome: Progressing   Problem: Nutritional: Goal: Maintenance of adequate nutrition will improve Outcome: Progressing Goal: Progress toward achieving an optimal weight will improve Outcome: Progressing   Problem: Skin Integrity: Goal: Risk for impaired skin integrity will decrease Outcome: Progressing   Problem: Tissue Perfusion: Goal: Adequacy of tissue perfusion will improve Outcome: Progressing   Problem: Education: Goal: Knowledge of General Education information will improve Description: Including pain rating scale, medication(s)/side effects and non-pharmacologic comfort measures Outcome: Progressing   Problem: Health Behavior/Discharge Planning: Goal: Ability to manage health-related needs will improve Outcome: Progressing   Problem: Clinical Measurements: Goal: Ability to maintain clinical measurements within normal limits will improve Outcome: Progressing Goal:  Will remain free from infection Outcome: Progressing Goal: Diagnostic test results will improve Outcome: Progressing Goal: Respiratory complications will improve Outcome: Progressing Goal: Cardiovascular complication will be avoided Outcome: Progressing   Problem: Activity: Goal: Risk for activity intolerance will decrease Outcome: Progressing   Problem: Nutrition: Goal: Adequate nutrition will be maintained Outcome: Progressing   Problem: Coping: Goal: Level of anxiety will decrease Outcome: Progressing   Problem: Elimination: Goal: Will not experience complications related to bowel motility Outcome: Progressing Goal: Will not experience complications related to urinary retention Outcome: Progressing   Problem: Pain Managment: Goal: General experience of comfort will improve and/or be controlled Outcome: Progressing   Problem: Safety: Goal: Ability to remain free from injury will improve Outcome: Progressing   Problem: Skin Integrity: Goal: Risk for impaired skin integrity will decrease Outcome: Progressing
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
## 2024-03-04 NOTE — TOC Transition Note (Signed)
 Transition of Care [facility]) - Discharge Note   Patient Details  Name: Joshus Rogan MRN: [id number] Date of Birth: 12-11-1955  Transition of Care [facility]) CM/SW Contact:  Ilario Dhaliwal M, RN Phone Number: 03/04/2024, 12:19 PM   Clinical Narrative:    Patient medically stable for discharge home today; he will have intermittent assistance from roommates.   Contacted [employer], patient's employer, per his request:  HR representative states this company does not offer short term disability.  Will relay to patient; employer asks that he call them to let them know how he is doing.    Final next level of care: Home/Self Care Barriers to Discharge: Barriers Resolved            Discharge Plan and Services Additional resources added to the After Visit Summary for  NA   Discharge Planning Services: CM Consult                                 Social Drivers of Health (SDOH) Interventions SDOH Screenings   Food Insecurity: No Food Insecurity (03/02/2024)  Housing: High Risk (03/02/2024)  Transportation Needs: No Transportation Needs (03/02/2024)  Utilities: Not At Risk (03/02/2024)  Depression (PHQ2-9): Low Risk  (12/20/2023)  Social Connections: Moderately Isolated (03/02/2024)  Tobacco Use: Medium Risk (03/02/2024)     Readmission Risk Interventions     No data to display         Mliss MICAEL Fass, RN, BSN  Trauma/Neuro ICU Case Manager [phone]

## 2024-03-24 ENCOUNTER — Ambulatory Visit (HOSPITAL_BASED_OUTPATIENT_CLINIC_OR_DEPARTMENT_OTHER): Payer: Self-pay

## 2024-03-24 ENCOUNTER — Ambulatory Visit (INDEPENDENT_AMBULATORY_CARE_PROVIDER_SITE_OTHER): Payer: Self-pay | Admitting: Family Medicine

## 2024-03-24 ENCOUNTER — Encounter (HOSPITAL_BASED_OUTPATIENT_CLINIC_OR_DEPARTMENT_OTHER): Payer: Self-pay | Admitting: Family Medicine

## 2024-03-24 ENCOUNTER — Other Ambulatory Visit (HOSPITAL_BASED_OUTPATIENT_CLINIC_OR_DEPARTMENT_OTHER): Payer: Self-pay

## 2024-03-24 VITALS — BP 131/78 | HR 104 | Temp 98.1°F | Resp 18 | Ht 62.6 in | Wt 147.0 lb

## 2024-03-24 DIAGNOSIS — K219 Gastro-esophageal reflux disease without esophagitis: Secondary | ICD-10-CM

## 2024-03-24 DIAGNOSIS — E119 Type 2 diabetes mellitus without complications: Secondary | ICD-10-CM

## 2024-03-24 DIAGNOSIS — S2249XA Multiple fractures of ribs, unspecified side, initial encounter for closed fracture: Secondary | ICD-10-CM

## 2024-03-24 DIAGNOSIS — G4700 Insomnia, unspecified: Secondary | ICD-10-CM

## 2024-03-24 DIAGNOSIS — Z7984 Long term (current) use of oral hypoglycemic drugs: Secondary | ICD-10-CM

## 2024-03-24 DIAGNOSIS — E785 Hyperlipidemia, unspecified: Secondary | ICD-10-CM

## 2024-03-24 MED ORDER — ATORVASTATIN CALCIUM 20 MG PO TABS
20.0000 mg | ORAL_TABLET | Freq: Every day | ORAL | 1 refills | Status: AC
  Filled 2024-03-24: qty 30, 30d supply, fill #0
  Filled 2024-03-24: qty 90, 90d supply, fill #0

## 2024-03-24 MED ORDER — OMEPRAZOLE 20 MG PO CPDR
20.0000 mg | DELAYED_RELEASE_CAPSULE | Freq: Every day | ORAL | 1 refills | Status: AC
  Filled 2024-03-24 (×2): qty 90, 90d supply, fill #0

## 2024-03-24 MED ORDER — METFORMIN HCL 1000 MG PO TABS
1000.0000 mg | ORAL_TABLET | Freq: Two times a day (BID) | ORAL | 1 refills | Status: AC
  Filled 2024-03-24 (×2): qty 180, 90d supply, fill #0

## 2024-03-24 NOTE — Patient Instructions (Addendum)
" °  Medication Instructions:  Your physician recommends that you continue on your current medications as directed. Please refer to the Current Medication list given to you today. --If you need a refill on any your medications before your next appointment, please call your pharmacy first. If no refills are authorized on file call the office.-- Lab Work: Your physician has recommended that you have lab work today: no If you have labs (blood work) drawn today and your tests are completely normal, you will receive your results via MyChart message OR a phone call from our staff.  Please ensure you check your voicemail in the event that you authorized detailed messages to be left on a delegated number. If you have any lab test that is abnormal or we need to change your treatment, we will call you to review the results.  Referrals/Procedures/Imaging: Xray today  Follow-Up: Your next appointment:   Your physician recommends that you schedule a follow-up appointment in: 3 months with Dr. de Cuba  **Phone number for [facility]: [phone]**  You will receive a text message or e-mail with a link to a survey about your care and experience with us  today! We would greatly appreciate your feedback!   Thanks for letting us  be apart of your health journey!!  Primary Care and Sports Medicine   Dr. Quintin sheerer Cuba   We encourage you to activate your patient portal called MyChart.  Sign up information is provided on this After Visit Summary.  MyChart is used to connect with patients for Virtual Visits (Telemedicine).  Patients are able to view lab/test results, encounter notes, upcoming appointments, etc.  Non-urgent messages can be sent to your provider as well. To learn more about what you can do with MyChart, please visit --  [email or web address].    "
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
## 2024-03-24 NOTE — Assessment & Plan Note (Signed)
 Requesting refill of omeprazole  today.  Was previously referred to GI, but has not scheduled appointment yet. No significant change in symptoms since prior evaluation. Refill omeprazole  today, provided patient with contact information for GI office so that he may call and schedule appointment.

## 2024-03-24 NOTE — Assessment & Plan Note (Signed)
 Patient did have A1c checked at time of hospitalization and remains well-controlled at 6.2%.  He continues with metformin , denies any issues with medication.  Does need refill of medication, refill sent to pharmacy. Foot exam up-to-date, urine ACR up-to-date. We will follow-up in about 3 months to monitor progress.

## 2024-03-24 NOTE — Assessment & Plan Note (Signed)
Patient continues with atorvastatin, has been tolerating well, denies any issues with myalgias.  Is requesting refill of medications today, refill provided

## 2024-03-24 NOTE — Assessment & Plan Note (Signed)
 Has noted issues with insomnia over the past week or so.  Not necessarily aware of any specific cause.  Denies significant impact pain or new issues since hospitalization.  Does note that part of his bedtime routine involves watching TV/being on the phone before bed.  Activity generally limited since discharge from hospital. We discussed considerations.  We reviewed sleep hygiene measures today.  Recommend avoidance of screen time at least 2 hours before bed.  Discussed gradual incorporation of physical activity including short walks during the day.  Discussed avoidance of naps.  Handout provided reviewing sleep hygiene recommendations.  Can also utilize OTC melatonin, advised on timing, potential side effects, usual dosage.
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
## 2024-05-07 ENCOUNTER — Ambulatory Visit (HOSPITAL_BASED_OUTPATIENT_CLINIC_OR_DEPARTMENT_OTHER): Payer: Self-pay | Admitting: Family Medicine

## 2024-06-23 ENCOUNTER — Ambulatory Visit (HOSPITAL_BASED_OUTPATIENT_CLINIC_OR_DEPARTMENT_OTHER): Payer: Self-pay | Admitting: Family Medicine
